# Patient Record
Sex: Female | Born: 1935 | Race: Black or African American | Hispanic: No
Health system: Southern US, Community
[De-identification: ages and names within clinical notes are randomized; demographics above are authoritative.]

## PROBLEM LIST (undated history)

## (undated) DIAGNOSIS — R1310 Dysphagia, unspecified: Secondary | ICD-10-CM

## (undated) DIAGNOSIS — F329 Major depressive disorder, single episode, unspecified: Secondary | ICD-10-CM

## (undated) DIAGNOSIS — F0390 Unspecified dementia without behavioral disturbance: Secondary | ICD-10-CM

## (undated) DIAGNOSIS — D649 Anemia, unspecified: Secondary | ICD-10-CM

## (undated) DIAGNOSIS — I10 Essential (primary) hypertension: Secondary | ICD-10-CM

## (undated) DIAGNOSIS — F32A Depression, unspecified: Secondary | ICD-10-CM

## (undated) DIAGNOSIS — N390 Urinary tract infection, site not specified: Secondary | ICD-10-CM

## (undated) DIAGNOSIS — F419 Anxiety disorder, unspecified: Secondary | ICD-10-CM

## (undated) DIAGNOSIS — C801 Malignant (primary) neoplasm, unspecified: Secondary | ICD-10-CM

---

## 1998-10-15 ENCOUNTER — Emergency Department (HOSPITAL_COMMUNITY): Admission: EM | Admit: 1998-10-15 | Discharge: 1998-10-15 | Payer: Self-pay | Admitting: Emergency Medicine

## 2000-01-03 ENCOUNTER — Emergency Department (HOSPITAL_COMMUNITY): Admission: EM | Admit: 2000-01-03 | Discharge: 2000-01-03 | Payer: Self-pay | Admitting: Emergency Medicine

## 2001-07-11 ENCOUNTER — Ambulatory Visit (HOSPITAL_COMMUNITY): Admission: RE | Admit: 2001-07-11 | Discharge: 2001-07-11 | Payer: Self-pay | Admitting: Family Medicine

## 2001-07-11 ENCOUNTER — Encounter: Payer: Self-pay | Admitting: Family Medicine

## 2001-07-31 ENCOUNTER — Ambulatory Visit (HOSPITAL_COMMUNITY): Admission: RE | Admit: 2001-07-31 | Discharge: 2001-07-31 | Payer: Self-pay | Admitting: Internal Medicine

## 2001-08-30 ENCOUNTER — Ambulatory Visit (HOSPITAL_COMMUNITY): Admission: RE | Admit: 2001-08-30 | Discharge: 2001-08-30 | Payer: Self-pay | Admitting: Internal Medicine

## 2001-08-30 ENCOUNTER — Encounter (INDEPENDENT_AMBULATORY_CARE_PROVIDER_SITE_OTHER): Payer: Self-pay | Admitting: Internal Medicine

## 2001-10-04 ENCOUNTER — Encounter: Admission: RE | Admit: 2001-10-04 | Discharge: 2002-01-02 | Payer: Self-pay | Admitting: Internal Medicine

## 2002-07-29 ENCOUNTER — Encounter: Payer: Self-pay | Admitting: Emergency Medicine

## 2002-07-29 ENCOUNTER — Emergency Department (HOSPITAL_COMMUNITY): Admission: EM | Admit: 2002-07-29 | Discharge: 2002-07-29 | Payer: Self-pay | Admitting: Emergency Medicine

## 2002-07-30 ENCOUNTER — Emergency Department (HOSPITAL_COMMUNITY): Admission: EM | Admit: 2002-07-30 | Discharge: 2002-07-30 | Payer: Self-pay | Admitting: Emergency Medicine

## 2002-08-01 ENCOUNTER — Ambulatory Visit (HOSPITAL_COMMUNITY): Admission: RE | Admit: 2002-08-01 | Discharge: 2002-08-01 | Payer: Self-pay | Admitting: Family Medicine

## 2002-08-01 ENCOUNTER — Encounter: Payer: Self-pay | Admitting: Family Medicine

## 2002-08-05 ENCOUNTER — Emergency Department (HOSPITAL_COMMUNITY): Admission: EM | Admit: 2002-08-05 | Discharge: 2002-08-05 | Payer: Self-pay | Admitting: *Deleted

## 2002-08-05 ENCOUNTER — Encounter: Payer: Self-pay | Admitting: *Deleted

## 2002-08-08 ENCOUNTER — Encounter: Payer: Self-pay | Admitting: Family Medicine

## 2002-08-08 ENCOUNTER — Ambulatory Visit (HOSPITAL_COMMUNITY): Admission: RE | Admit: 2002-08-08 | Discharge: 2002-08-08 | Payer: Self-pay | Admitting: Family Medicine

## 2002-09-22 ENCOUNTER — Emergency Department (HOSPITAL_COMMUNITY): Admission: EM | Admit: 2002-09-22 | Discharge: 2002-09-22 | Payer: Self-pay | Admitting: *Deleted

## 2002-11-24 ENCOUNTER — Emergency Department (HOSPITAL_COMMUNITY): Admission: EM | Admit: 2002-11-24 | Discharge: 2002-11-24 | Payer: Self-pay | Admitting: Emergency Medicine

## 2002-12-24 ENCOUNTER — Emergency Department (HOSPITAL_COMMUNITY): Admission: EM | Admit: 2002-12-24 | Discharge: 2002-12-24 | Payer: Self-pay | Admitting: Emergency Medicine

## 2003-12-08 ENCOUNTER — Ambulatory Visit (HOSPITAL_COMMUNITY): Admission: RE | Admit: 2003-12-08 | Discharge: 2003-12-08 | Payer: Self-pay | Admitting: Family Medicine

## 2004-09-11 ENCOUNTER — Emergency Department (HOSPITAL_COMMUNITY): Admission: EM | Admit: 2004-09-11 | Discharge: 2004-09-11 | Payer: Self-pay | Admitting: Emergency Medicine

## 2005-08-23 ENCOUNTER — Ambulatory Visit (HOSPITAL_COMMUNITY): Admission: RE | Admit: 2005-08-23 | Discharge: 2005-08-23 | Payer: Self-pay | Admitting: Family Medicine

## 2005-08-25 ENCOUNTER — Inpatient Hospital Stay (HOSPITAL_COMMUNITY): Admission: RE | Admit: 2005-08-25 | Discharge: 2005-09-02 | Payer: Self-pay | Admitting: Family Medicine

## 2005-08-27 ENCOUNTER — Encounter: Payer: Self-pay | Admitting: Orthopedic Surgery

## 2005-08-28 ENCOUNTER — Ambulatory Visit: Payer: Self-pay | Admitting: Orthopedic Surgery

## 2005-09-29 ENCOUNTER — Ambulatory Visit (HOSPITAL_COMMUNITY): Admission: RE | Admit: 2005-09-29 | Discharge: 2005-09-29 | Payer: Self-pay | Admitting: Internal Medicine

## 2005-10-08 ENCOUNTER — Emergency Department (HOSPITAL_COMMUNITY): Admission: EM | Admit: 2005-10-08 | Discharge: 2005-10-08 | Payer: Self-pay | Admitting: Emergency Medicine

## 2005-10-13 ENCOUNTER — Ambulatory Visit: Payer: Self-pay | Admitting: Orthopedic Surgery

## 2005-12-08 ENCOUNTER — Ambulatory Visit: Payer: Self-pay | Admitting: Orthopedic Surgery

## 2006-01-18 ENCOUNTER — Emergency Department (HOSPITAL_COMMUNITY): Admission: EM | Admit: 2006-01-18 | Discharge: 2006-01-18 | Payer: Self-pay | Admitting: Emergency Medicine

## 2006-04-08 ENCOUNTER — Inpatient Hospital Stay (HOSPITAL_COMMUNITY): Admission: RE | Admit: 2006-04-08 | Discharge: 2006-04-10 | Payer: Self-pay | Admitting: Internal Medicine

## 2006-06-02 ENCOUNTER — Emergency Department (HOSPITAL_COMMUNITY): Admission: EM | Admit: 2006-06-02 | Discharge: 2006-06-02 | Payer: Self-pay | Admitting: Emergency Medicine

## 2007-07-16 IMAGING — CR DG FEMUR 2V*L*
2 series · 2 of 2 positions shown · non-contrast
Comparison: none

CLINICAL DATA: Left hip pain. Pain in left hip and knee, looking for fracture. 
 LEFT HIP:
 Acute left femoral neck fracture with coxa vara deformity. The distal fragment is displaced superiorly and laterally in relation to the proximal fragment/femoral head. This appears to basically represent a subcapital fracture.

[view not recorded (1 of 2)]
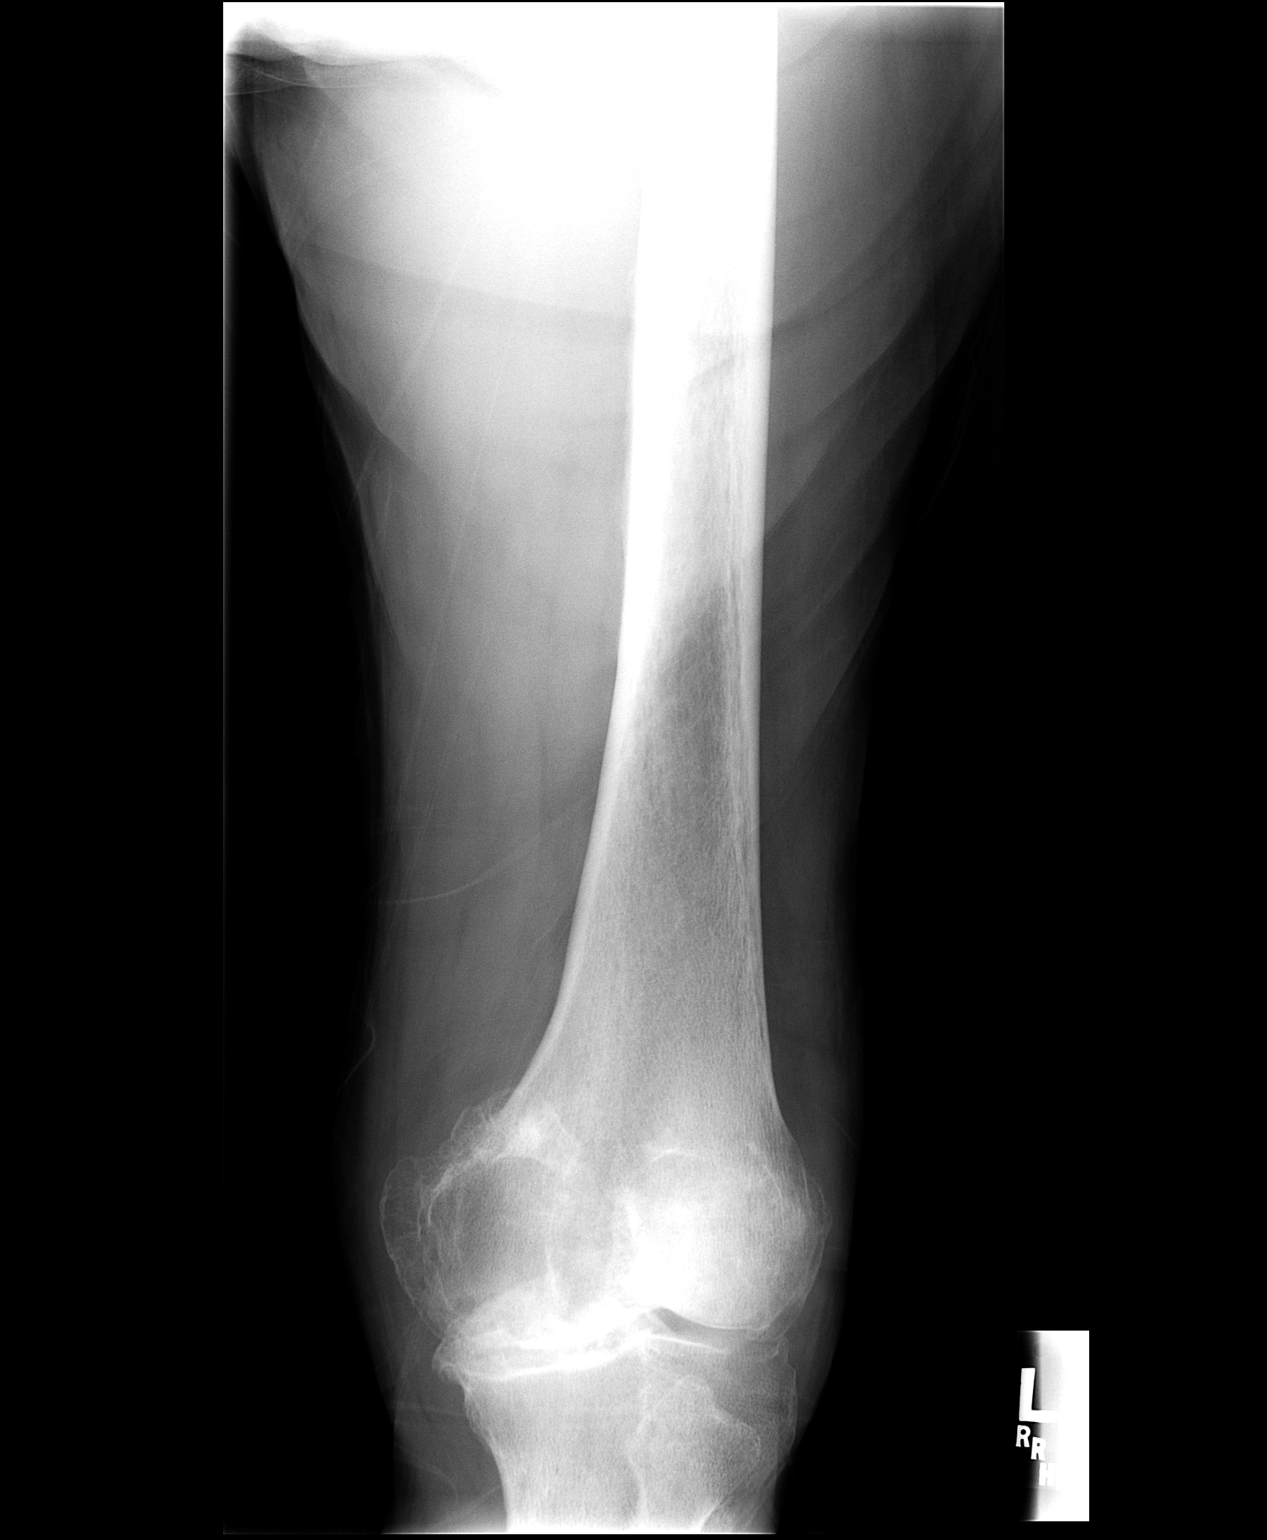

[view not recorded (2 of 2)]
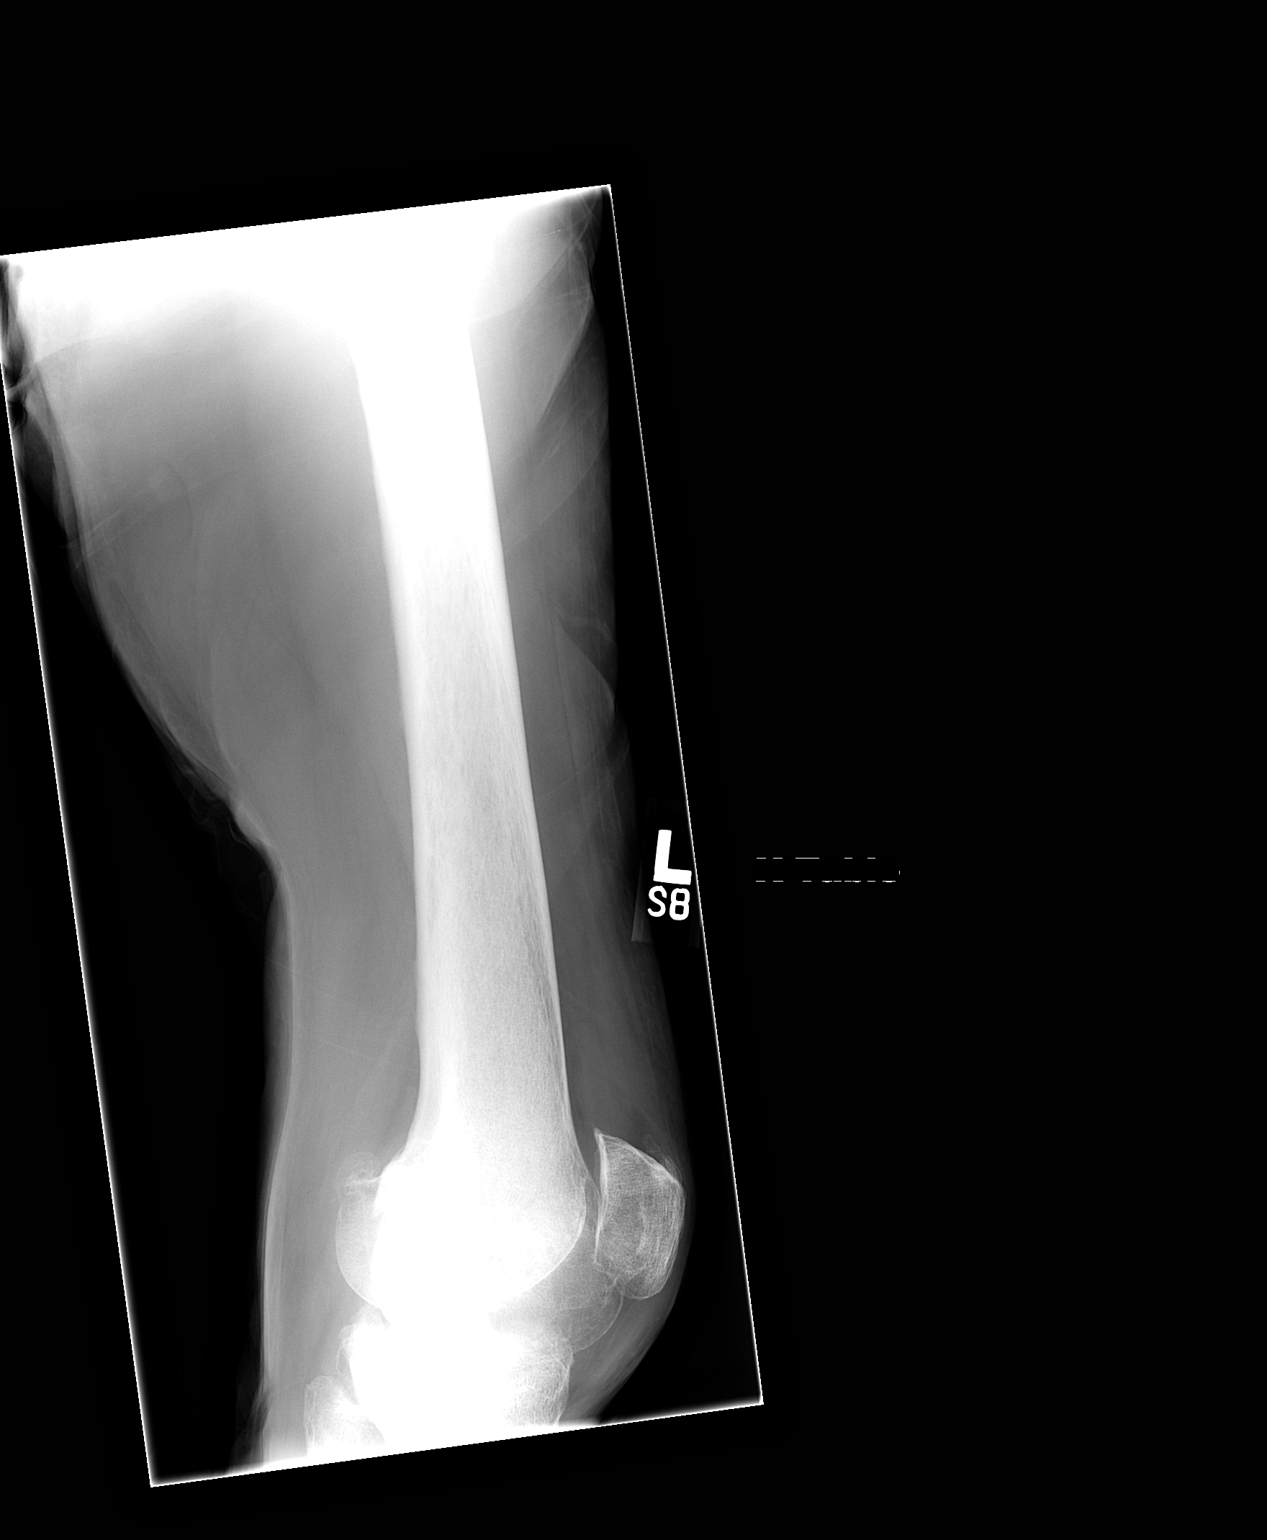

[2 of 2 positions shown; findings below may reference images not displayed]

IMPRESSION: Left femoral subcapital fracture. 
 LEFT FEMUR:
 Left femoral subcapital fracture as described above.   Incidental degenerative O/A changes of the left knee, particularly medial compartment.
IMPRESSION: The most impressive observation is the acute left femoral neck fracture.

## 2007-07-16 IMAGING — CR DG HIP (WITH OR WITHOUT PELVIS) 2-3V*L*
3 series · 3 of 3 positions shown · non-contrast
Comparison: none

CLINICAL DATA: Left hip pain. Pain in left hip and knee, looking for fracture. 
 LEFT HIP:
 Acute left femoral neck fracture with coxa vara deformity. The distal fragment is displaced superiorly and laterally in relation to the proximal fragment/femoral head. This appears to basically represent a subcapital fracture.

[view not recorded (1 of 3)]
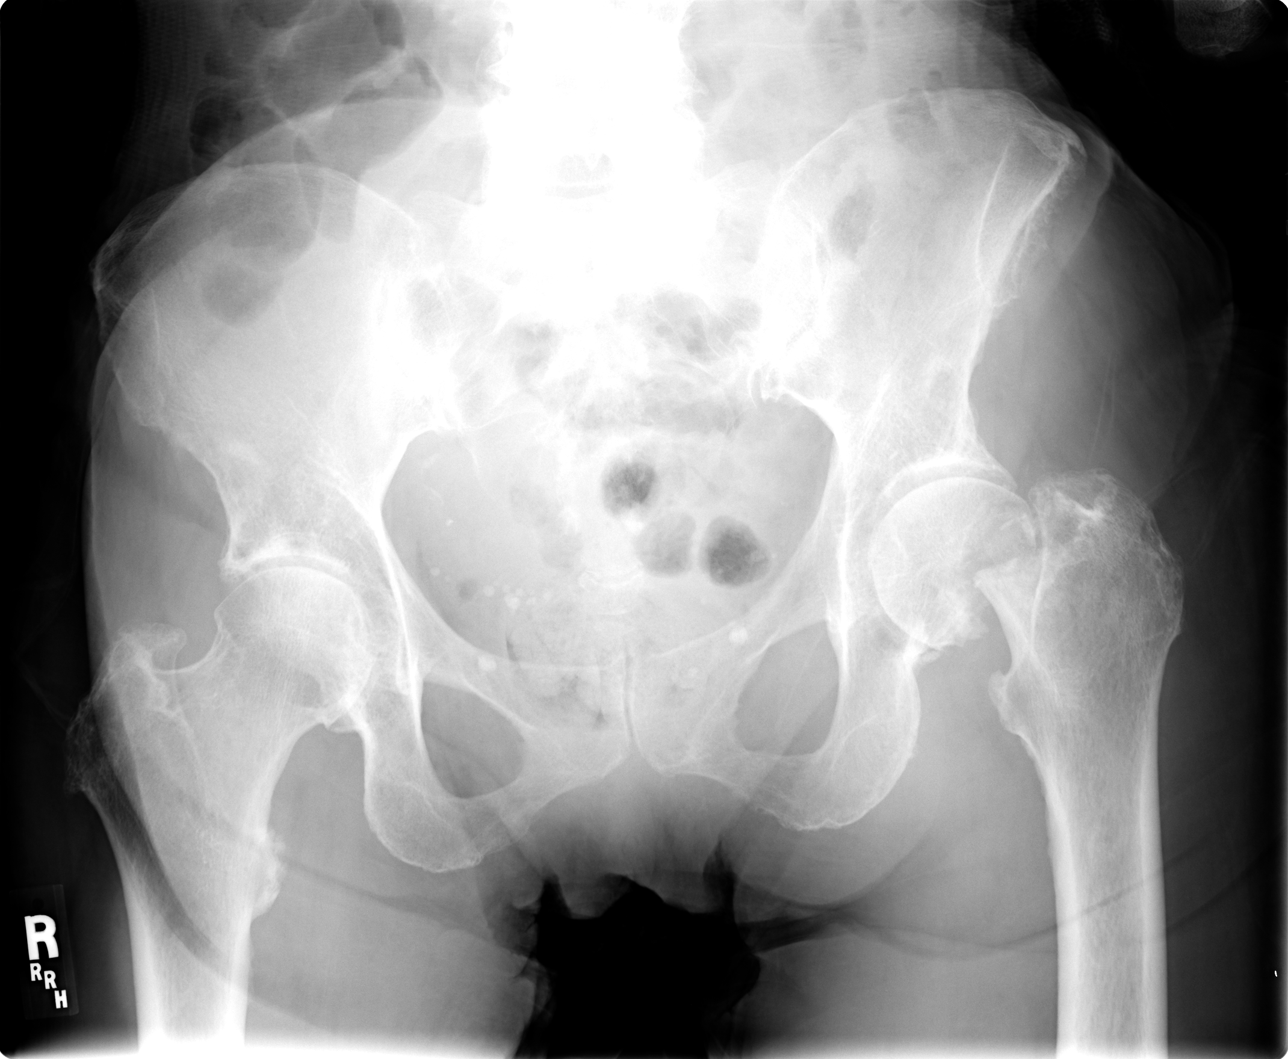

[view not recorded (2 of 3)]
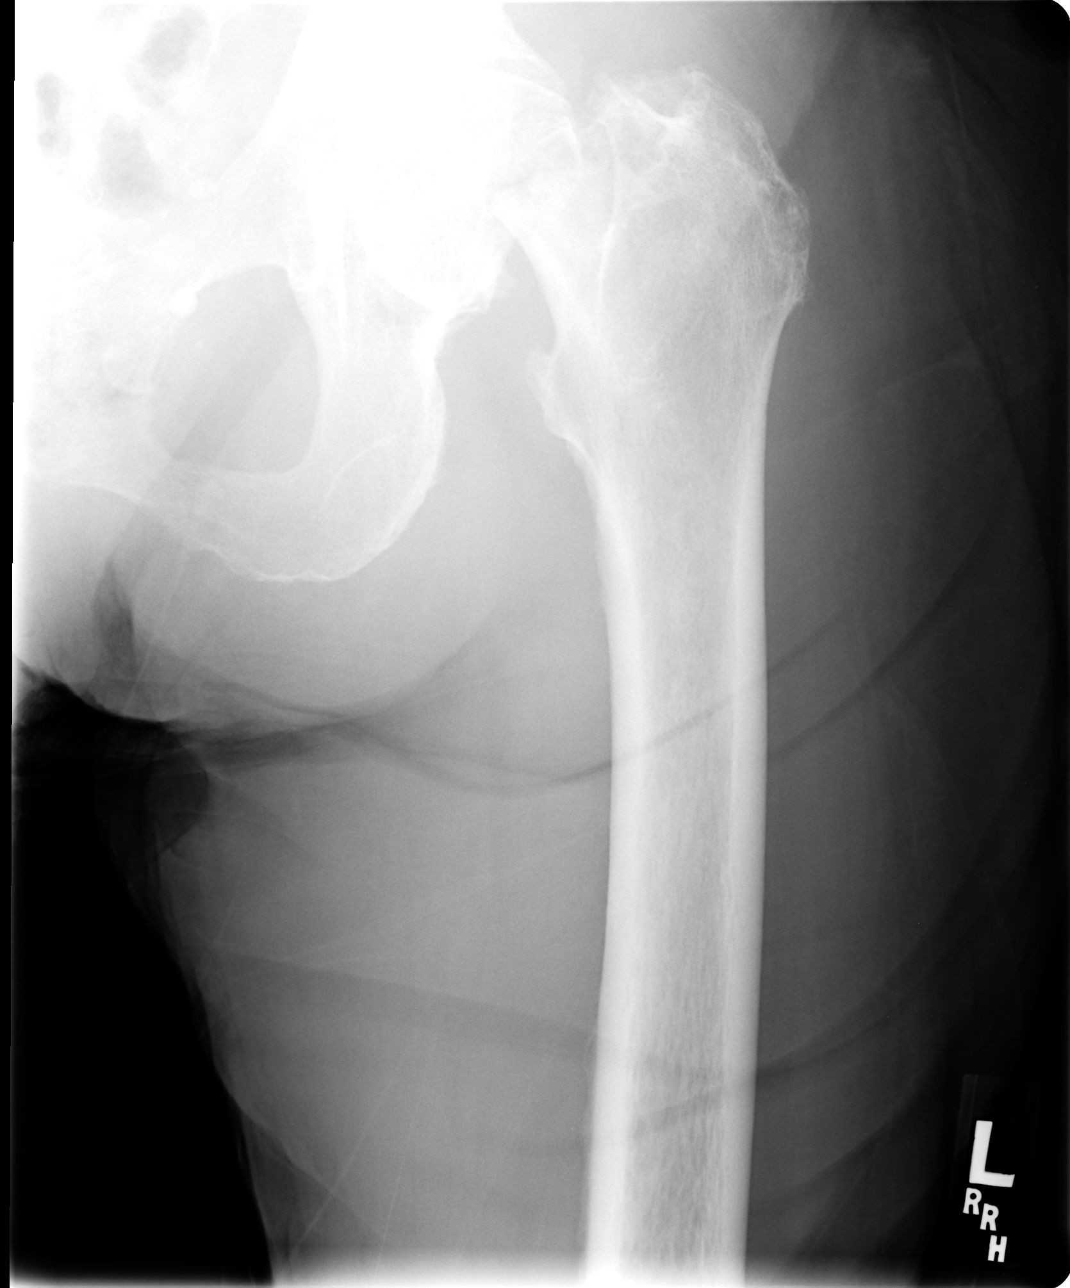

[view not recorded (3 of 3)]
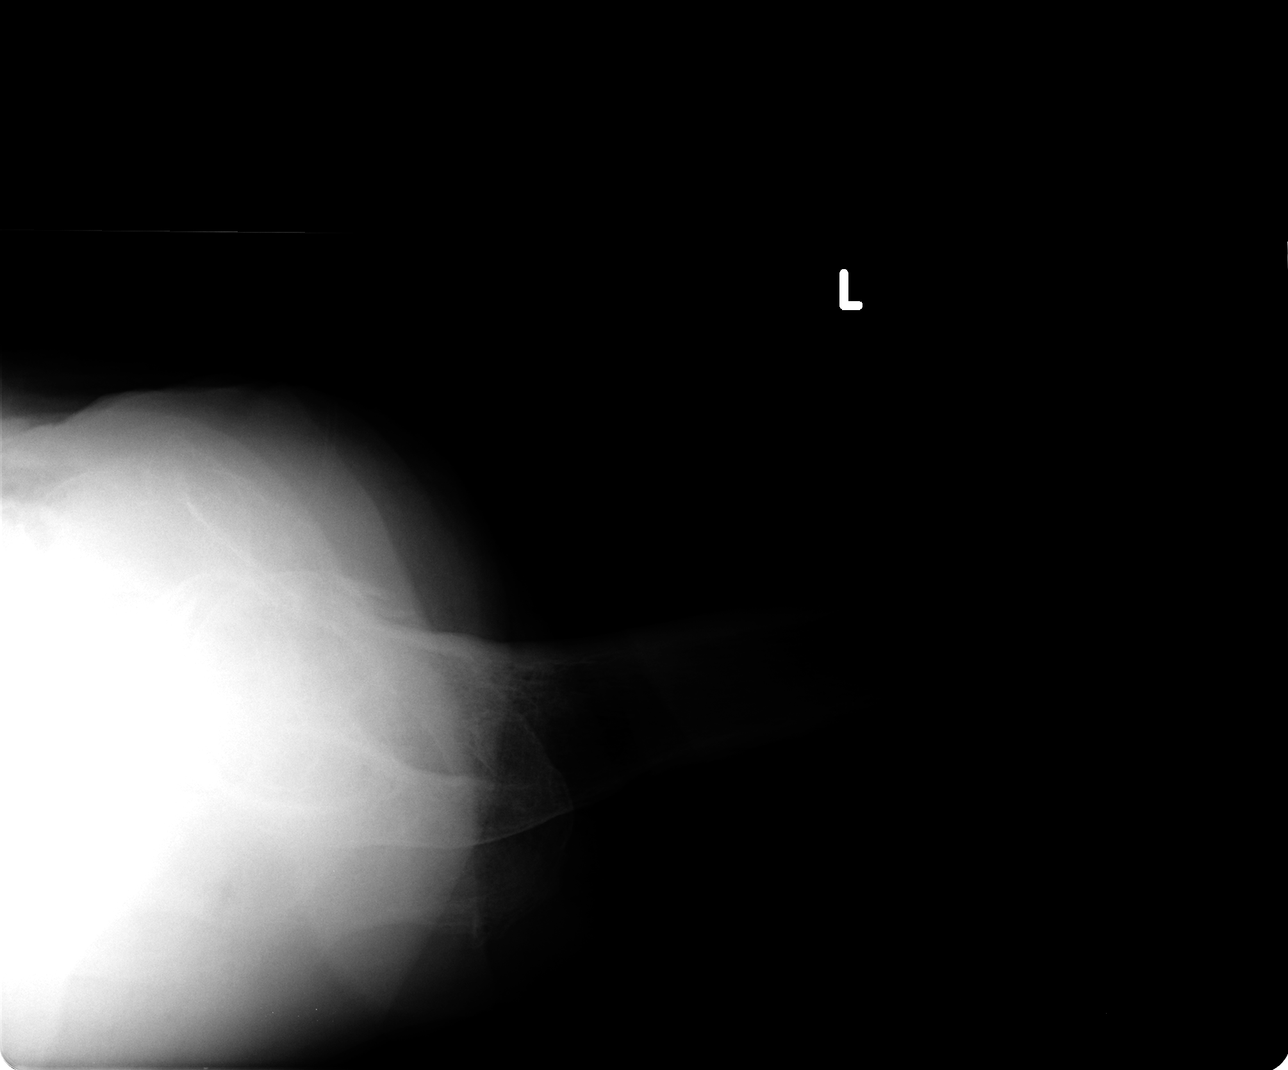

[3 of 3 positions shown; findings below may reference images not displayed]

IMPRESSION: Left femoral subcapital fracture. 
 LEFT FEMUR:
 Left femoral subcapital fracture as described above.   Incidental degenerative O/A changes of the left knee, particularly medial compartment.
IMPRESSION: The most impressive observation is the acute left femoral neck fracture.

## 2008-12-01 ENCOUNTER — Inpatient Hospital Stay (HOSPITAL_COMMUNITY): Admission: EM | Admit: 2008-12-01 | Discharge: 2008-12-05 | Payer: Self-pay | Admitting: Emergency Medicine

## 2009-04-15 ENCOUNTER — Emergency Department (HOSPITAL_COMMUNITY): Admission: EM | Admit: 2009-04-15 | Discharge: 2009-04-15 | Payer: Self-pay | Admitting: Emergency Medicine

## 2009-04-19 ENCOUNTER — Emergency Department (HOSPITAL_COMMUNITY): Admission: EM | Admit: 2009-04-19 | Discharge: 2009-04-19 | Payer: Self-pay | Admitting: Emergency Medicine

## 2010-12-18 ENCOUNTER — Encounter: Payer: Self-pay | Admitting: Family Medicine

## 2010-12-19 ENCOUNTER — Encounter: Payer: Self-pay | Admitting: Internal Medicine

## 2010-12-19 ENCOUNTER — Encounter: Payer: Self-pay | Admitting: Family Medicine

## 2011-03-08 LAB — CBC
HCT: 35.4 % — ABNORMAL LOW (ref 36.0–46.0)
Hemoglobin: 12.1 g/dL (ref 12.0–15.0)
MCHC: 34.3 g/dL (ref 30.0–36.0)
MCV: 87.9 fL (ref 78.0–100.0)
Platelets: 190 10*3/uL (ref 150–400)
RBC: 4.03 MIL/uL (ref 3.87–5.11)
RDW: 15.4 % (ref 11.5–15.5)
WBC: 12.5 10*3/uL — ABNORMAL HIGH (ref 4.0–10.5)

## 2011-03-08 LAB — URINALYSIS, ROUTINE W REFLEX MICROSCOPIC
Bilirubin Urine: NEGATIVE
Glucose, UA: NEGATIVE mg/dL
Ketones, ur: NEGATIVE mg/dL
Leukocytes, UA: NEGATIVE
Nitrite: NEGATIVE
Protein, ur: 30 mg/dL — AB
Specific Gravity, Urine: 1.03 (ref 1.005–1.030)
Urobilinogen, UA: 0.2 mg/dL (ref 0.0–1.0)
pH: 6 (ref 5.0–8.0)

## 2011-03-08 LAB — DIFFERENTIAL
Basophils Absolute: 0 10*3/uL (ref 0.0–0.1)
Basophils Relative: 0 % (ref 0–1)
Eosinophils Absolute: 0 10*3/uL (ref 0.0–0.7)
Eosinophils Relative: 0 % (ref 0–5)
Lymphocytes Relative: 12 % (ref 12–46)
Lymphs Abs: 1.5 10*3/uL (ref 0.7–4.0)
Monocytes Absolute: 1.3 10*3/uL — ABNORMAL HIGH (ref 0.1–1.0)
Monocytes Relative: 10 % (ref 3–12)
Neutro Abs: 9.7 10*3/uL — ABNORMAL HIGH (ref 1.7–7.7)
Neutrophils Relative %: 78 % — ABNORMAL HIGH (ref 43–77)

## 2011-03-08 LAB — BASIC METABOLIC PANEL
BUN: 16 mg/dL (ref 6–23)
CO2: 30 mEq/L (ref 19–32)
Calcium: 9 mg/dL (ref 8.4–10.5)
Chloride: 102 mEq/L (ref 96–112)
Creatinine, Ser: 0.89 mg/dL (ref 0.4–1.2)
GFR calc Af Amer: >60 mL/min (ref >60)
GFR calc non Af Amer: >60 mL/min (ref >60)
Glucose, Bld: 99 mg/dL (ref 70–99)
Potassium: 4.5 mEq/L (ref 3.5–5.1)
Sodium: 138 mEq/L (ref 135–145)

## 2011-03-08 LAB — URINE MICROSCOPIC-ADD ON

## 2011-03-14 LAB — COMPREHENSIVE METABOLIC PANEL
ALT: 15 U/L (ref 0–35)
AST: 18 U/L (ref 0–37)
Calcium: 8.9 mg/dL (ref 8.4–10.5)
Creatinine, Ser: 2.68 mg/dL — ABNORMAL HIGH (ref 0.4–1.2)
GFR calc Af Amer: 20 mL/min — ABNORMAL LOW (ref >60)
GFR calc non Af Amer: 17 mL/min — ABNORMAL LOW (ref >60)
Glucose, Bld: 161 mg/dL — ABNORMAL HIGH (ref 70–99)
Sodium: 157 mEq/L — ABNORMAL HIGH (ref 135–145)
Total Protein: 7.9 g/dL (ref 6.0–8.3)

## 2011-03-14 LAB — URINALYSIS, ROUTINE W REFLEX MICROSCOPIC
Glucose, UA: NEGATIVE mg/dL
Nitrite: POSITIVE — AB
Specific Gravity, Urine: >1.03 — ABNORMAL HIGH (ref 1.005–1.030)
pH: 5.5 (ref 5.0–8.0)

## 2011-03-14 LAB — VALPROIC ACID LEVEL: Valproic Acid Lvl: <10 ug/mL — ABNORMAL LOW (ref 50.0–100.0)

## 2011-03-14 LAB — CBC
MCHC: 31.4 g/dL (ref 30.0–36.0)
MCV: 86.3 fL (ref 78.0–100.0)
RDW: 16.6 % — ABNORMAL HIGH (ref 11.5–15.5)

## 2011-03-14 LAB — DIFFERENTIAL
Eosinophils Absolute: 0 10*3/uL (ref 0.0–0.7)
Lymphocytes Relative: 10 % — ABNORMAL LOW (ref 12–46)
Lymphs Abs: 1.5 10*3/uL (ref 0.7–4.0)
Monocytes Relative: 2 % — ABNORMAL LOW (ref 3–12)
Neutrophils Relative %: 88 % — ABNORMAL HIGH (ref 43–77)

## 2011-03-14 LAB — BASIC METABOLIC PANEL
BUN: 15 mg/dL (ref 6–23)
BUN: 51 mg/dL — ABNORMAL HIGH (ref 6–23)
BUN: 71 mg/dL — ABNORMAL HIGH (ref 6–23)
CO2: 28 mEq/L (ref 19–32)
CO2: 29 mEq/L (ref 19–32)
CO2: 29 mEq/L (ref 19–32)
CO2: 31 mEq/L (ref 19–32)
Calcium: 8.3 mg/dL — ABNORMAL LOW (ref 8.4–10.5)
Chloride: 113 mEq/L — ABNORMAL HIGH (ref 96–112)
Chloride: 119 mEq/L — ABNORMAL HIGH (ref 96–112)
Chloride: 121 mEq/L — ABNORMAL HIGH (ref 96–112)
Creatinine, Ser: 1.33 mg/dL — ABNORMAL HIGH (ref 0.4–1.2)
Creatinine, Ser: 1.59 mg/dL — ABNORMAL HIGH (ref 0.4–1.2)
GFR calc Af Amer: 46 mL/min — ABNORMAL LOW (ref >60)
GFR calc non Af Amer: 20 mL/min — ABNORMAL LOW (ref >60)
Glucose, Bld: 104 mg/dL — ABNORMAL HIGH (ref 70–99)
Glucose, Bld: 132 mg/dL — ABNORMAL HIGH (ref 70–99)
Glucose, Bld: 75 mg/dL (ref 70–99)
Glucose, Bld: 96 mg/dL (ref 70–99)
Potassium: 3.8 mEq/L (ref 3.5–5.1)
Potassium: 3.9 mEq/L (ref 3.5–5.1)
Potassium: 4.4 mEq/L (ref 3.5–5.1)
Sodium: 144 mEq/L (ref 135–145)
Sodium: 156 mEq/L — ABNORMAL HIGH (ref 135–145)

## 2011-03-14 LAB — URINE MICROSCOPIC-ADD ON

## 2011-03-14 LAB — URINE CULTURE

## 2011-04-12 NOTE — Discharge Summary (Signed)
[name], [name]                ACCOUNT NO.:  [id number]   MEDICAL RECORD NO.:  [id number]          PATIENT TYPE:  INP   LOCATION:  A316                          FACILITY:  [facility]   PHYSICIAN:  Tesfaye D. Felecia Shelling, MD   DATE OF BIRTH:  [birth year]   DATE OF ADMISSION:  12/01/2008  DATE OF DISCHARGE:  01/08/2010LH                               DISCHARGE SUMMARY   DISCHARGE DIAGNOSIS:  1. Acute renal insufficiency secondary to dehydration.  2. Hypernatremia.  3. E. coli (Escherichia coli) urinary tract infection.  4. __________.  5. History of cancer of the breast.  6. Remote history of pulmonary embolism.   DISCHARGE MEDICATIONS:  1. Celexa 20 mg p.o. daily.  2. __________ mg daily.  3. Aricept 10 mg daily.  4. Clonazepam 0.5 mg b.i.d.  5. Namenda 10 mg b.i.d.  6. Risperdal 0.25 mg at bedtime.  7. Depakote 125 mg at bedtime.  8. Lortab 5/500 one tablet p.o. q.4 hours p.r.n.  9. Augmentin 500 mg p.o. t.i.d. for 3 more days.   DISPOSITION:  The patient will be discharged back to [facility].   HOSPITAL COURSE:  This is an 77 year old female patient who was a  resident of a nursing home who was brought to the emergency room due to  change in mental status.  During evaluation in the emergency room the  patient was found to have significant renal insufficiency secondary to  dehydration.  Her sodium was also elevated.  She has sign of urinary  tract infection.  Her urine culture grew E. coli.  The patient was  treated with IV Rocephin.  Over the hospital stay the patient improved.  Her urine was corrected to normal range with a BUN of 50, creatinine  1.5.  The patient is back to her baseline.  She will be discharged back  to nursing home to continue her regular treatment.      Tesfaye D. Felecia Shelling, MD  Electronically Signed    TDF/MEDQ  D:  12/05/2008  T:  12/05/2008  Job:  [id number]

## 2011-04-12 NOTE — H&P (Signed)
Yolanda [name], Yolanda [name]                ACCOUNT NO.:  [id number]   MEDICAL RECORD NO.:  [id number]          PATIENT TYPE:  INP   LOCATION:  A316                          FACILITY:  [facility]   PHYSICIAN:  Tesfaye D. Felecia Shelling, MD   DATE OF BIRTH:  23-Jan-1921   DATE OF ADMISSION:  12/01/2008  DATE OF DISCHARGE:  LH                              HISTORY & PHYSICAL   CHIEF COMPLAINT:  Change in mental status.   HISTORY OF PRESENT ILLNESS:  This is an 77 year old female patient with  history of multiple medical issues who is a resident of [facility] who was brought to the emergency room with the above complaint. The  nursing staff noticed decline in patient's condition in the last three  days. Her p.o. intake decreased, and the patient was unable to ambulate.  She was more confused and decerebrated. She was brought to the emergency  room where she was evaluated and found to have elevated BUN, creatinine,  and sodium. The patient also had signs of urinary tract infection. She  was started on IV antibiotics and IV fluids. The patient is admitted for  further treatment.   REVIEW OF SYSTEMS:  The patient is currently confused and disoriented  and unable to give any history.   PAST MEDICAL HISTORY:  1. Advanced dementia.  2. Anemia of chronic disease.  3. History of pulmonary embolism.  4. Hypertension.  5. Osteoporosis.  6. History of remote CA of the breast.  7. History of CVA.   CURRENT MEDICATIONS:  1. Celexa 20 mg daily.  2. Lisinopril 10 mg daily.  3. Aricept 10 mg daily.  4. Clonazepam 0.5 mg b.i.d.  5. Namenda 10 mg b.i.d.  6. Risperidone 0.25 mg at bedtime.  7. Depakote 225 mg at bedtime.  8. Lortab 5/500 one tablet p.o. q.4 h. P.r.n.   SOCIAL HISTORY:  The patient is currently a resident of [facility]. No history of alcohol, tobacco, or substance abuse.   PHYSICAL EXAMINATION:  The patient is opening her eyes. She is confused,  disoriented, and __________ blood  pressure 125/61, pulse 70, respiratory  rate 18, temperature 97.8 degrees Fahrenheit.  HEENT:  Pupils are equal and reactive.  NECK:  Is supple.  CHEST:  Decreased air entry, few rhonchi.  CARDIOVASCULAR SYSTEM:  First and second heart sounds heard, regular.  ABDOMEN:  Soft and lax. Bowel sounds are positive. No mass or  organomegaly.  EXTREMITIES:  No leg edema.   ADMISSION LABORATORY DATA:  CBC:  WBC 14.9, hemoglobin 12.9, hematocrit  41.2, platelets 219. BMP:  Sodium 157, potassium 4.0, chloride 118,  carbon dioxide 32, glucose 161, BUN 75, creatinine 2.8, and calcium 8.9.  Urinalysis showed WBC 25-50, bacteria many, pH 5.5, and specific gravity  greater than 1013.   ASSESSMENT:  1. Acute renal insufficiency secondary to dehydration.  2. Hypernatremia.  3. Urinary tract infection.  4. Severe dementia.  5. Hypertension.  6. History of pulmonary embolism.  7. Remote history of carcinoma of the breast.   PLAN:  Will continue patient on IV fluids.  Will try to graduate  rehydrate. Continue IV Rocephin. Will continue to assist and have daily  living activity. Continue her regular medications.      Tesfaye D. Felecia Shelling, MD  Electronically Signed     TDF/MEDQ  D:  12/02/2008  T:  12/02/2008  Job:  [id number]

## 2011-04-15 NOTE — Discharge Summary (Signed)
Yolanda [name], GUYETT                ACCOUNT NO.:  [id number]   MEDICAL RECORD NO.:  [id number]          PATIENT TYPE:  INP   LOCATION:  A318                          FACILITY:  [facility]   PHYSICIAN:  Hanley Hays. Dechurch, M.D.DATE OF BIRTH:  [birth year]   DATE OF ADMISSION:  08/25/2005  DATE OF DISCHARGE:  [facility]                                 DISCHARGE SUMMARY   DIAGNOSES:  1.  Left hip fracture post fall, status post left hip unipolar replacement.  2.  Dementia, progressive moderate to severe.  3.  Osteoporosis.  4.  Remote history of breast cancer without evidence of recurrent.  5.  History of a small left parieto-occipital focus with possible old      lacunar infarction.  6.  Urinary tract infection, urine culture pending.  7.  Anemia postoperative requiring transfusion, hemoglobin 10 at the time of      discharge.  8.  Depression.   DISPOSITION:  Skilled nursing facility for rehab.   MEDICATIONS:  1.  Namenda 10 b.i.d.  2.  Aricept 10 h.s.  3.  Prozac 20 daily.  4.  Tylenol 1 gram t.i.d. for 2 weeks, then q.6h. p.r.n. pain.  5.  Lovenox 40 mg subcu to continue for 21 days.  6.  Ativan 0.5 mg q.4h. p.o. p.r.n. anxiety.  7.  Lortab 5/500 q.4h. p.r.n. pain.   DIET:  Regular diet.   ACTIVITY:  PT, OT.   HOSPITAL COURSE:  An 77 year old African-American female, retired Runner, broadcasting/film/video  with dementia who lives at home with her elderly husband who is also in the  hospital at this time as well as 24-hour caregivers fell several days prior  to admission. Because of ongoing complaints of pain, she had an x-ray  performed which revealed the left femoral subcapital fracture. She was seen  in consultation by Dr. Romeo Apple. After she was cleared medically, she  underwent surgery on August 27, 2005. She underwent unipolar hip  replacement. She tolerated the procedure well. She had postoperative anemia  requiring 1 unit of packed red cells and hemoglobins remained stable. She  did have some  postoperative fever. A follow-up urinalysis was unremarkable  with the cultures growing gram-negative rods. She will be empirically  started on Levaquin for 3 days while culture is pending. On echocardiogram,  she was noted to have mild to moderate aortic stenosis but no significant  flow obstruction. There is also question of echodensity just below mitral  valve which may be attributed to mitral valve apparatus which has been torn  or a subvalvular defect. Though possibility of vegetation could not be ruled  out, the patient had no evidence to suggest that she had SBE. In addition,  her EF was estimated at 40-45% with mild global hypokinesis and diastolic  dysfunction of the elderly. She had mild concentric LVH as well. In the  hospital, her blood pressures were in the 138s and 140s. Initially, she was  quite confused but returned to her baseline status. Her pain was managed  well. Initially, she was going to go home with her 24-hour caregivers.  However,  family is intending to move the patient to be closer to them in  [place] and feels that she would be better served in a skilled nursing  facility. Arrangements were made for that to occur.   At the time of discharge, this is a pleasant elderly female, alert, verbally  fluent without any complaints.  LUNGS:  Her lungs were clear to auscultation.  HEART:  Heart is regular. There is a soft systolic murmur at the right  sternal border which radiates laterally but no gallop was present.  ABDOMEN:  The abdomen is soft and nontender.  EXTREMITIES:  Without clubbing, cyanosis. There is no edema. She has an  abduction pillow place.  SKIN:  Her skin exam reveals a sacral prominence with 3 cm wounds, silver-  dollar size on the left and quarter size on the right which were stage II.  This should be treated with DuoDerm. Her heels were intact.  NEUROLOGICAL:  Baseline mental status and nonfocal exam.   ASSESSMENT/PLAN:  1.  Status post left hip  fracture post fall.  2.  Progressive dementia.  3.  Depression.  4.  Urinary tract infection.  5.  Postoperative anemia status post 1 unit packed red cells.  6.  Sacral decubitus ulcers.  7.  Mild left ventricular dysfunction by echocardiogram without evidence of      decompensation.  8.  Borderline hypertension.   Given her advanced age and progressive dementia, our goal is to optimize her  comfort and functional status. At this point, no further interventions we  entertained. This can be further managed in the nursing facility with  observation.      Hanley Hays Josefine Class, M.D.  Electronically Signed     FED/MEDQ  D:  09/01/2005  T:  09/01/2005  Job:  [id number]

## 2011-04-15 NOTE — Op Note (Signed)
Yolanda [name], Yolanda [name] NO.:  [id number]   MEDICAL RECORD NO.:  [id number]          PATIENT TYPE:  INP   LOCATION:  A318                          FACILITY:  [facility]   PHYSICIAN:  Vickki Hearing, M.D.DATE OF BIRTH:  Mar 26, 1921   DATE OF PROCEDURE:  08/27/2005  DATE OF DISCHARGE:                                 OPERATIVE REPORT   HISTORY:  This is an 77 year old female who fell and fractured her left hip  last Sunday, complained of pain a few days later. X-ray showed the fractured  hip. She was admitted under the services of Dr. Josefine Class. After medical  clearance, she was scheduled for surgery.   PREOPERATIVE DIAGNOSIS:  Displaced left femoral neck fracture.   POSTOPERATIVE DIAGNOSIS:  Displaced left femoral neck fracture.   PROCEDURE:  Left hip prosthetic replacement with a DePuy Press-Fit unipolar  hip stem, size 5 femur, -3 neck, 49 head.   ANESTHETIC:  Spinal.   SURGEON:  Dr. Romeo Apple assisted by Irena Reichmann.   BLOOD LOSS:  Approximately 50 cc. There was no drain. No complications.   The surgery was done as follows:  The patient's leg had previously been  marked for surgery, history and physical was updated, and consent was  signed. Antibiotics were given. He was taken to the operating room and had a  spinal anesthetic, placed laterally in the decubitus position left side up.  After sterile prep and drape, time-out was taken and completed.   Incision was made and centered over the greater trochanter, taken down to  the underlying fascia which was split in line with the skin incision  exposing the abductor mechanism. This was peeled from the greater trochanter  using the anterior two thirds. This was reflected superiorly. The capsule  was preserved via incision and placing stay sutures in the capsule. Head was  removed, measured size 49. Acetabulum was inspected, irrigated, cleaned. Hip  was dislocated anteriorly using a box osteotome, starter reamer,  and canal  finder. The proximal femur was entered, entry point was lateralized, and the  broaches were started at a size 1, 3, 4, and 5; got a nice tight fit,  trialed a -3 with a 49 head, reduced the hip, got a stable reduction,  removed the trial components, passed sutures through drill holes in the  greater trochanter, placed our regular prosthesis and head and neck,  rereduced the hip, ranged it, got the same range of motion as initial trial,  and then closed the hip capsule with #1 Bralon, closed the abductors with  the sutures which were passed through the drill holes in the trochanter with  #5 Tycron. Then we closed the fascia with #1 Bralon, 2-0  Monocryl for the subcu, staples for the skin. We injected 30 cc of  Sensorcaine with epinephrine, placed sterile dressings, and then put the  patient on regular bed with abduction pillow in place.   We will have a standard protocol with full weightbearing, and she will  probably need rehab placement.      Vickki Hearing, M.D.  Electronically Signed  SEH/MEDQ  D:  08/27/2005  T:  08/27/2005  Job:  [id number]

## 2011-04-15 NOTE — Procedures (Signed)
Yolanda [name], Yolanda [name]                ACCOUNT NO.:  [id number]   MEDICAL RECORD NO.:  [id number]          PATIENT TYPE:  INP   LOCATION:  A318                          FACILITY:  [facility]   PHYSICIAN:  Dani Gobble, MD       DATE OF BIRTH:  1921-01-13   DATE OF PROCEDURE:  08/26/2005  DATE OF DISCHARGE:                                  ECHOCARDIOGRAM   DATE OF PROCEDURE:  August 26, 2005   REFERRING:  Dr. Josefine [name]   INDICATION:  Yolanda [name] is an 77 year old female who had a left hip  fracture who was referred for syncope.   The technical quality of this study is adequate.   The aorta measures normally at 3 cm.   The left atrium also appears to be within normal limits measured at 3.4 cm.   The interventricular septum and posterior wall are mildly thickened measured  at 1.3 cm for each.   The aortic valve is not well visualized but is heavily calcified with  limited leaflet excursion. No significant aortic insufficiency is noted.  Doppler interrogation of the aortic valve reveals a peak velocity of 2 2  m/sec corresponding to a peak gradient of 18 mm Hg and a mean gradient of 14  mm Hg with an area by Doppler of 0.7 sq cm. Visually this appears to be  moderate aortic stenosis although by Doppler interrogation it appears to be  more on the milder end of the spectrum. Leaflet mobility is still  recognized.   The mitral valve appears mild to moderately thickened. There is moderate  mitral annular calcification. There appears to be some malcoaptation of the  valve leaflets but mitral valve prolapse is not appreciated. Mild mitral  regurgitation is noted. There is a mobile echodensity just below the mitral  valve apparatus which may represent torn minor subvalvular apparatus but  vegetation certainly needs to be included in the differential diagnosis.  Doppler interrogation of the mitral valve is within normal limits.   The pulmonic valve is not well visualized.   The tricuspid  valve appears grossly structurally normal. Mild tricuspid  regurgitation is noted. Estimated RVSP is approximately  38 mm Hg.   The left ventricle is normal in size with the LVIDD measured at 4.2 cm and  the LVISD measured at 3.2 cm. Overall left ventricular systolic function is  diminished with an estimated ejection fraction of 40-45%. There is mild  global hypokinesis while the anterior septal wall appears moderate to  severely hypokinetic. Additionally, the inferior and posterior walls also  are suggestive of hypokinesis although this is not clearly delineated. The  presence of diastolic dysfunction is inferred from pulsar Doppler across the  mitral valve.   The right atrium and right ventricular systolic function appears to be  normal.   IMPRESSION:  1.  Mild concentric left ventricular hypertrophy.  2.  Mild to moderate aortic stenosis: Visually it appears to be more      moderate and by Doppler it appears to be mild. Leaflet mobility is still      appreciated.  3.  Thickened mitral valve without significant limitation to leaflet      excursion. There does appear to be some mild coaptation of the leaflets      although mitral valve prolapse is not appreciated. Mild mitral      regurgitation is noted.  4.  There is a mobile echodensity just below the mitral valve apparatus and      may be attached to the mitral valve apparatus which may represent torn      minor subvalvular apparatus, however, the possibility of a vegetation      should also be considered in the differential diagnosis.  5.  Mild to moderate mitral annular calcification.  6.  Mild tricuspid regurgitation.  7.  Mild pulmonary hypertension.  8.  Normal left ventricular size with diminished ejection fraction estimated      at 40-45%. There is mild global hypokinesis, although the anteroseptal      wall appears to be moderately to severely hypokinetic.  9.  Presence of diastolic dysfunction is inferred from pulsar  Doppler across      the mitral valve.  10. Right ventricle systolic function appears to be preserved.           ______________________________  Dani Gobble, MD     AB/MEDQ  D:  08/26/2005  T:  08/26/2005  Job:  [id number]   cc:   Yolanda [name]. Yolanda [name], M.D.  Fax: [phone]

## 2011-04-15 NOTE — Consult Note (Signed)
NAMEPHYLICIA, Yolanda [name]                ACCOUNT NO.:  [id number]   MEDICAL RECORD NO.:  [id number]          PATIENT TYPE:  INP   LOCATION:  A318                          FACILITY:  [facility]   PHYSICIAN:  Vickki Hearing, M.D.DATE OF BIRTH:  09/18/1921   DATE OF CONSULTATION:  08/26/2005  DATE OF DISCHARGE:                                   CONSULTATION   REQUESTING PHYSICIAN:  Hanley Hays. Dechurch, M.D.   CHIEF COMPLAINT:  Left hip fracture.   HISTORY:  This is an 77 year old female who lives at home with an elderly  husband and a 24-hour caregiver, who apparently fell on Sunday, complained  of hip pain on the 28th, and was brought in after radiographs showed a left  femoral neck fracture.  She has dementia.  Complete history is taken from  the dictated history and physical by Dr. Josefine Class.   She is on Aricept and Namenda with no allergies.   Social history, review of systems is recorded.   My examination is as follows:  VITAL SIGNS:  Temperature 97, respiratory rate 18, pulse 77, blood pressure  166/76.  GENERAL:  This is a thin female, tall, lean, resting comfortably on her  back, noncombative.  CARDIOVASCULAR:  No peripheral edema, good pulses, normal temperature.  SKIN:  Intact, including the heels, with no evidence of pressure sore.  EXTREMITIES:  Her upper extremities show no lesions, no bruising.  NEUROLOGIC:  Mental status:  Again, dementia, but awake and alert.  Neurologic:  No focal signs.  LYMPHATIC:  Normal cervical, supraclavicular, groin.  MUSCULOSKELETAL:  Gait and station is deferred.  The patient has a hip  fracture.  Upper extremities:  Alignment normal, no subluxation,  dislocation, atrophy, tremor or malalignment.  Lower extremity right:  Normal range of motion, strength, stability, alignment.  No subluxation of  the joints.  No dislocations, atrophy of tremor.  Left lower extremity in  traction.  Any attempts at motion cause pain.   LABORATORY EXAMINATION:   Chem-7 normal.  Calcium 8.6.  Hemoglobin 11.9,  platelet count 184.  LFTs normal.  Protein total 5.5, low.  Albumin 2.8,  low.  Calcium 8.5, normal.  PT/INR 12.7 and 0.9.   Radiographs show a left femoral neck fracture with significant amount of  shortening.  This is a complete displaced fracture.   DIAGNOSIS:  Left femoral neck fracture.   PLAN:  Prosthetic replacement, left hip.      Vickki Hearing, M.D.  Electronically Signed     SEH/MEDQ  D:  08/27/2005  T:  08/27/2005  Job:  [id number]

## 2011-04-15 NOTE — H&P (Signed)
Yolanda [name], Yolanda [name]                ACCOUNT NO.:  [id number]   MEDICAL RECORD NO.:  [id number]          PATIENT TYPE:  INP   LOCATION:  A217                          FACILITY:  [facility]   PHYSICIAN:  Hanley Hays. Dechurch, M.D.DATE OF BIRTH:  May 17, 1921   DATE OF ADMISSION:  04/08/2006  DATE OF DISCHARGE:  LH                                HISTORY & PHYSICAL   HISTORY OF PRESENT ILLNESS:  The patient is an 77 year old resident of  [facility] with moderate to severe dementia, history of a left hip fracture in  October 2006, who is basically bed to chair with assist in ambulation, who  was in her usual state of health until yesterday when it was noted by  nursing staff that she had increasing edema in her left lower extremity.  A  D-dimer was ordered and returned at 17.  She was empirically started on  Lovenox, and ultrasound to the left lower leg revealed no evidence of DVT;  however, CT of the chest revealed multiple pulmonary emboli primarily in the  right lung.  She had no complaints of shortness of breath.  She has had some  intermittent hypertension but has had no deterioration in status.  However,  she is being admitted to the hospital for anticoagulation therapy.   PAST MEDICAL HISTORY:  1.  Left hip fracture status post unipolar hip replacement, October 2006,      without complication.  2.  Progressive dementia with combativeness and agitation, improved.  3.  Osteoporosis.  4.  Remote history of breast cancer.  5.  History of left parieto-occipital lacunar infarct.  6.  History of hypertension.  7.  History of anemia, postoperative.   MEDICATIONS:  1.  Namenda 10 b.i.d.  2.  Aricept 10 daily.  3.  Citalopram 20 daily.  4.  Risperdal 1 mg b.i.d.  5.  Klonopin 0.5 b.i.d.  6.  Calcium plus D t.i.d. and a multivitamin with minerals daily.  7.  Lortab 5/500 q.4 h p.r.n. pain or Tylenol as needed.  8.  Xanax 0.25 mg q.4 h p.r.n. anxiety.  9.  Risperdal 0.5 mg p.o. q.4 h p.r.n.  paranoia.   ALLERGIES:  No known drug allergies.   REVIEW OF SYSTEMS:  She requires assistance for all ADLs.  She is usually  able to communicate her needs.  She does sundown and occasionally becomes  quite agitated, but this has been improved.  Weight has been stable; p.o.  intake is good.  She is incontinent of bladder but occasionally is able to  be continent.  She has no pain complaints, no respiratory complaints,  otherwise has remained clinically stable.  Blood pressure in the nursing  home were noted to be sometimes with systolics in the 160s with diastolics  in the 90s but for the most part, they are actually on the lower side.   PHYSICAL EXAMINATION:  GENERAL:  Physical exam reveals a well-developed,  well-nourished elderly female who is alert.  Denies any complaints.  She  cannot provide any history secondary to her dementia.  VITAL SIGNS:  NECK:  No  adenopathy or JVD.  LUNGS:  Clear to auscultation.  Respiratory rate 18.  HEART:  Regular.  There is a small systolic murmur at the right sternal  border, radiating laterally, but no gallop is present.  ABDOMEN:  Soft and nontender.  EXTREMITIES:  Edema of the left lower extremity.  Right lower extremity  without edema.  She moves all extremities x4, but she does not always  consistently follow commands.  She can answer some simple questions but is  disoriented to time and place.  Verbal ability is she is fairly fluent, and  this is her baseline.  She has no reported skin rash, lesion, or breakdown.   ASSESSMENT/PLAN:  1.  Pulmonary embolus with no evidence of deep venous thrombosis on      ultrasound, raising the question of a pelvic source.  In any event, she      will be anticoagulated with the planned treatment of 6 months or      indefinitely.  Given her advanced age, no further evaluation is      indicated.  2.  Dementia with agitation.  Continue her usual regimen which she is      tolerating reasonably well.  3.   Hypertension by history.  We will monitor during the hospital stay.  4.  Osteoporosis.  She remains on calcium and vitamin D.  5.  Remote history of breast cancer, unlikely this is playing a role in her      current presentation.  She says this was many years ago, and she has no      evidence of acute disease, no further workup at this time unless further      indications.  The patient does have an advanced directive, no CPR,      mechanical ventilation which will be honored, but we will continue      aggressive supportive care.  She is listed as a full code which as I had      recalled, though, she and her husband had an advanced directive.  We      will review this.      Hanley Hays Josefine Class, M.D.  Electronically Signed     FED/MEDQ  D:  04/08/2006  T:  04/08/2006  Job:  [id number]

## 2011-04-15 NOTE — Procedures (Signed)
Yolanda [name], Yolanda [name]                ACCOUNT NO.:  [id number]   MEDICAL RECORD NO.:  [id number]           PATIENT TYPE:   LOCATION:                                 FACILITY:   PHYSICIAN:  Kasilof Bing, M.D.  DATE OF BIRTH:  1921/03/11   DATE OF PROCEDURE:  DATE OF DISCHARGE:                                EKG INTERPRETATION   1.  Normal sinus rhythm.  2.  Single premature ventricular contractions.  3.  Biatrial enlargement.  4.  Abnormal R wave progression/possible prior anterior myocardial      infarction.  5.  Left ventricular hypertrophy by voltage.  6.  Nonspecific ST-T wave abnormality.      RR/MEDQ  D:  09/14/2004  T:  09/14/2004  Job:  [id number]

## 2011-04-15 NOTE — Group Therapy Note (Signed)
Yolanda [name], Yolanda [name]                ACCOUNT NO.:  [id number]   MEDICAL RECORD NO.:  [id number]          PATIENT TYPE:  INP   LOCATION:  A318                          FACILITY:  [facility]   PHYSICIAN:  Mila Homer. Sudie Bailey, M.D.DATE OF BIRTH:  June 01, 1921   DATE OF PROCEDURE:  08/28/2005  DATE OF DISCHARGE:                                   PROGRESS NOTE   SUBJECTIVE:  She has no complaints today.  She had a good deal of pain in  the left hip when they tried to turn today, but she is comfortable now.   OBJECTIVE:  Temperature 98.9, pulse 81, respirations 20, blood pressure  120/64.  She is alert but disoriented.  She is in no acute distress.  She is  very thin, well-developed.  Lungs are clear throughout moving air well.  There are no intracostal contractions, use of accessory muscles on  respiration.  Heart has a regular rhythm rate of about 80.  Abdomen is soft  without tenderness, organomegaly or masses.  No edema of the feet.  She  seems to be wiggling her toes well.  Today, her white cell count is 7900,  H&H 10.4 and 37.3.  Met-7 normal.   ASSESSMENT:  1.  Status post left hip fracture with repair by Dr. Darreld Mclean      yesterday.  2.  Progressive dementia.   PLAN:  Continue current regimen.      Mila Homer. Sudie Bailey, M.D.  Electronically Signed     SDK/MEDQ  D:  08/28/2005  T:  08/29/2005  Job:  [id number]

## 2011-04-15 NOTE — Discharge Summary (Signed)
Yolanda [name], Yolanda [name]                ACCOUNT NO.:  [id number]   MEDICAL RECORD NO.:  [id number]          PATIENT TYPE:  INP   LOCATION:  A217                          FACILITY:  [facility]   PHYSICIAN:  Hanley Hays. Dechurch, M.D.DATE OF BIRTH:  1921/07/01   DATE OF ADMISSION:  04/08/2006  DATE OF DISCHARGE:  05/14/2007LH                                 DISCHARGE SUMMARY   DIAGNOSES:  1.  Acute pulmonary embolus, essentially asymptomatic.  2.  History of hip fracture, October 2006 with LLE edema, DVT negative by      doppler  3.  Severe dementia.  4.  Anemia, stable.  5.  Osteoporosis.  6.  Remote history of breast cancer.  7.  Remote history of left parieto-occipital lacunar infarct.  8.  History of hypertension.   DISPOSITION:  The patient is being discharged to [facility].   MEDICATIONS:  1.  Coumadin 7.5 mg tonight and then 5 mg nightly.  PT INR Apr 12, 2006.  2.  Namenda 10 mg b.i.d.  3.  Aricept 10 mg daily.  4.  Risperdal 1 mg at 8 a.m. and 1700 p.m.  5.  Multivitamin with minerals 1 daily.  6.  Celexa 20 mg daily.  7.  Calcium plus D 500 mg t.i.d.  8.  Lovenox 70 mg q.12 h. until INR greater than 2 times two successive      days.  9.  Klonopin 0.25 mg b.i.d.  10. Oxycodone 5 mg q.3 h. p.r.n. pain.   Regular diet with snacks t.i.d.  She will need a CBC and BMP on Apr 12, 2006.   FOLLOW UP:  By myself at the nursing facility.   HOSPITAL COURSE:  The patient is an 77 year old, African-American female,  who is a resident of [facility], where she has been stable with  the exception of dementia with combative and psychotic behaviors, who has a  remote history of left hip fracture.  She is essentially bed to chair.  She  was noted to have some increased edema in her left lower extremity.  D-dimer  was elevated at greater than 17.  Interestingly, Doppler exam revealed no  evidence of DVT in the left lower extremity, however, she had multiple PEs  noted on  CT scan most notable in the descending and segmental branches of  the right lower lobe.  She was actually very asymptomatic with this.  In any  event, she was admitted to the hospital for the institution of Lovenox and  Coumadin.  Aside from her dementia and being confused, she had no  complications.  She remained hemodynamically stable.  O2 saturations were  97% on room air.  She is being discharged back to the facility for ongoing  management.  She will continue her Lovenox until her INR is greater than 2  on two successive assessments and then Coumadin probably indefinitely.  No  further evaluation was entertained at this time.  At the time of discharge,  she is alert, pleasant, conversant and confused in no distress.  Respiratory  rate on room air  is about 12.  Her lungs are clear to auscultation  anteriorly and posteriorly.  The heart is regular.  I cannot appreciate a  murmur.  Abdomen is soft and nontender.  Extremities without clubbing or  cyanosis.  She has 2+ edema of the left lower extremity but no other  changes.  She moves all extremities x4.  Her mental status is baseline.      Hanley Hays Yolanda [name], M.D.  Electronically Signed     FED/MEDQ  D:  04/10/2006  T:  04/10/2006  Job:  [id number]

## 2011-04-15 NOTE — Group Therapy Note (Signed)
NAMEJOURDIN, GENS                ACCOUNT NO.:  [id number]   MEDICAL RECORD NO.:  [id number]          PATIENT TYPE:  INP   LOCATION:  A318                          FACILITY:  [facility]   PHYSICIAN:  Vickki Hearing, M.D.DATE OF BIRTH:  [birth year]   DATE OF PROCEDURE:  DATE OF DISCHARGE:                                   PROGRESS NOTE   She is postoperative day two status post a unipolar prosthetic replacement  of the left hip, doing well, in stable condition.  She should be up with  therapy today.  She can go home probably by tomorrow or the next day.  She  has a 24-hour caregiver.  She should receive therapy at home.  She will need  to be on some Lovenox for approximately 1 month.      Vickki Hearing, M.D.  Electronically Signed     SEH/MEDQ  D:  08/29/2005  T:  08/29/2005  Job:  [id number]

## 2011-04-15 NOTE — Group Therapy Note (Signed)
Yolanda [name], Yolanda [name]                ACCOUNT NO.:  [id number]   MEDICAL RECORD NO.:  [id number]          PATIENT TYPE:  INP   LOCATION:  A318                          FACILITY:  [facility]   PHYSICIAN:  Vickki Hearing, M.D.DATE OF BIRTH:  October 29, 1921   DATE OF PROCEDURE:  DATE OF DISCHARGE:                                   PROGRESS NOTE   Postop followup left hip unipolar replacement.  She is in stable condition.  Baseline mental status.  Tolerating some sitting up.  Is essentially bed-to-  chair.  Does reportedly walk maybe a little bit, and we can advance her  therapy as tolerated.  Discharged to home.  Orthopedic instructions are left  in the medical record.      Vickki Hearing, M.D.  Electronically Signed     SEH/MEDQ  D:  08/31/2005  T:  08/31/2005  Job:  [id number]

## 2011-04-15 NOTE — Group Therapy Note (Signed)
Yolanda [name], Yolanda [name]                ACCOUNT NO.:  [id number]   MEDICAL RECORD NO.:  [id number]          PATIENT TYPE:  INP   LOCATION:  A318                          FACILITY:  [facility]   PHYSICIAN:  Vickki Hearing, M.D.DATE OF BIRTH:  Aug 26, 1921   DATE OF PROCEDURE:  DATE OF DISCHARGE:                                   PROGRESS NOTE   Postop day one status post unipolar replacement of the left hip.  She is in  stable condition, stable vital signs, hemoglobin 10.8, she is doing well.  We will make discharge plans for her starting tomorrow.  She does have a 24-  hour care giver and will probably be able to go back home.      Vickki Hearing, M.D.  Electronically Signed     SEH/MEDQ  D:  08/28/2005  T:  08/28/2005  Job:  [id number]

## 2011-04-15 NOTE — H&P (Signed)
Yolanda, [name]                ACCOUNT NO.:  [id number]   MEDICAL RECORD NO.:  [id number]          PATIENT TYPE:  INP   LOCATION:  A318                          FACILITY:  [facility]   PHYSICIAN:  Yolanda Hays. [name], M.D.DATE OF BIRTH:  1921-06-30   DATE OF ADMISSION:  08/25/2005  DATE OF DISCHARGE:  LH                                HISTORY & PHYSICAL   HISTORY OF PRESENT ILLNESS:  The patient is an 77 year old, African-American  female who lives with her elderly husband and 24-hour caregivers who  apparently fell on Sunday, but today started complaining of increasing leg  pain.  She was seen in the office and sent for a left lower extremity x-ray  which reveals a left femoral neck fracture.  The patient was actually  complaining of weakness of the left lower extremity as well and underwent  MRI scan yesterday which revealed no acute findings aside from an old  lacunar and atrophy.  She has a past medical history of breast carcinoma  without recurrence and progressive Alzheimer's dementia.  She has no history  of coronary artery disease, CVA, diabetes, hypertension, etc.  She has not  been hospitalized in many, many years.  Information is obtained from her  husband who does have some cognitive impairment as well and her caregivers  know her quite intimately.   MEDICATIONS:  1.  Namenda 10 mg b.i.d.  2.  Aricept 10 at h.s.   ALLERGIES:  No known drug allergies.   SOCIAL HISTORY:  She is married.  She lives with her elderly husband who has  multiple medical problems currently getting ready to be placed on dialysis.  She has around the clock caregivers with no alcohol or tobacco abuse.  There  is a son and a daughter who live out of state.   REVIEW OF SYSTEMS:  CONSTITUTIONAL:  The patient is usually wheelchair  bound.  She transfers with assistance.  She walks short distances with  assistance only.  She has had some falls, but no injuries up until this  episode.  NEUROLOGIC:  She  sundowns and gets agitated at night which is not  unusual.  She occasionally has some anxiety, but for the most part has been  doing okay.  Caregiver has noted urinary frequency at night, no GI or GU  complaints.  GASTROINTESTINAL:  She has a fairly good appetite, although  thin her weight has been stable.   PHYSICAL EXAMINATION:  GENERAL:  A thin, elderly female.  She is complaining  of pain in her left leg, left knee and left chest wall.  She is in no  distress lying supine otherwise.  HEENT:  There is no JVD or adenopathy.  The oropharynx is moist.  Pupils are  equal, round and reactive to light.  LUNGS:  Clear to auscultation anterior and posterior.  HEART:  Regular, rate about 84.  I cannot appreciate a murmur or gallop.  ABDOMEN:  Flat, soft, no masses noted.  EXTREMITIES:  Without clubbing, cyanosis.  The left lower extremity is  externally rotated and shortened with some angulation at  the thigh, but no  bruising.  There is no skin rash, lesion or breakdown.  She is able to move  all extremities x4.  She can follow simple commands.  She clearly has short  term memory issues and is disoriented to time and place and situation.   ASSESSMENT/PLAN:  1.  Acute left femoral neck fracture, post fall.  2.  Moderate to severe dementia.  3.  Question chest pain, per patient.  Again, history is very limited      secondary to her severe dementia.  She has not been complaining of any      symptoms according to her spouse or staff.  We will get initial set of      cardiac enzymes and baseline electrocardiogram.  I am going to order an      echocardiogram as well.  She did have an abnormal electrocardiogram in      October 2005, and we will get the actual chart and review.  4.  She will need an orthopedics consult.  She has been placed in Buck's      traction at 5 pounds and pain medications have been instituted.  5.  Continue her usual medications and manage expectantly.      Yolanda Hays  Yolanda [name], M.D.  Electronically Signed     FED/MEDQ  D:  08/25/2005  T:  08/25/2005  Job:  [id number]

## 2011-04-15 NOTE — Group Therapy Note (Signed)
NAMEJHENE, WESTMORELAND                ACCOUNT NO.:  [id number]   MEDICAL RECORD NO.:  [id number]          PATIENT TYPE:  INP   LOCATION:  A318                          FACILITY:  [facility]   PHYSICIAN:  Mila Homer. Sudie Bailey, M.D.DATE OF BIRTH:  10-22-1921   DATE OF PROCEDURE:  DATE OF DISCHARGE:                                   PROGRESS NOTE   SUBJECTIVE:  An 77 year old incurred a fracture of the left hip which was  operated on by Dr. Hilda Lias this morning.  She is a resident of [facility].  Lives with her husband.  She is suffering progressive dementia over the last  10 years.  Even though I have taken care of her off and on over the years  before Dr. Josefine Class did, she fails to recognize me.   OBJECTIVE:  VITAL SIGNS: Temperature 98.2, pulse 91, respirations 20, blood  pressure 147/78.  GENERAL:  She is very thin, emaciated.  She is well-developed and she  appears to be alert although she is totally disoriented.  Her sentence  structure is intact.  There is no slurring of her speech.  LUNGS:  Clear throughout.  She is moving air well.  HEART:  Fairly regular rhythm and rate of 90.  ABDOMEN:  Soft.  EXTREMITIES:  She is wiggling her toes, left more than right.   LABORATORY DATA:  Hemoglobin 11.8, hematocrit 34.7. MET-7 normal.   ASSESSMENT:  1.  Status post hip fracture, now repaired by Dr. Darreld Mclean today.  2.  Progressive dementia.   PLAN:  Continue current treatments.  Follow up with Dr. Drenda Freeze DeChurch in two  days.      Mila Homer. Sudie Bailey, M.D.  Electronically Signed     SDK/MEDQ  D:  08/27/2005  T:  08/28/2005  Job:  [id number]

## 2014-03-28 ENCOUNTER — Encounter (HOSPITAL_COMMUNITY): Payer: Self-pay | Admitting: Emergency Medicine

## 2014-03-28 ENCOUNTER — Inpatient Hospital Stay (HOSPITAL_COMMUNITY)
Admission: EM | Admit: 2014-03-28 | Discharge: 2014-04-01 | DRG: 641 | Disposition: A | Discharge status: Skilled Nursing Facility | Payer: Medicare Other | Attending: Internal Medicine | Admitting: Internal Medicine

## 2014-03-28 ENCOUNTER — Emergency Department (HOSPITAL_COMMUNITY): Payer: Medicare Other

## 2014-03-28 DIAGNOSIS — E870 Hyperosmolality and hypernatremia: Principal | ICD-10-CM | POA: Diagnosis present

## 2014-03-28 DIAGNOSIS — I10 Essential (primary) hypertension: Secondary | ICD-10-CM | POA: Diagnosis present

## 2014-03-28 DIAGNOSIS — F411 Generalized anxiety disorder: Secondary | ICD-10-CM | POA: Diagnosis present

## 2014-03-28 DIAGNOSIS — D649 Anemia, unspecified: Secondary | ICD-10-CM | POA: Diagnosis present

## 2014-03-28 DIAGNOSIS — F419 Anxiety disorder, unspecified: Secondary | ICD-10-CM | POA: Diagnosis present

## 2014-03-28 DIAGNOSIS — E860 Dehydration: Secondary | ICD-10-CM

## 2014-03-28 DIAGNOSIS — Z66 Do not resuscitate: Secondary | ICD-10-CM | POA: Diagnosis present

## 2014-03-28 DIAGNOSIS — Z7401 Bed confinement status: Secondary | ICD-10-CM

## 2014-03-28 DIAGNOSIS — D696 Thrombocytopenia, unspecified: Secondary | ICD-10-CM | POA: Diagnosis present

## 2014-03-28 DIAGNOSIS — A498 Other bacterial infections of unspecified site: Secondary | ICD-10-CM | POA: Diagnosis present

## 2014-03-28 DIAGNOSIS — N390 Urinary tract infection, site not specified: Secondary | ICD-10-CM | POA: Diagnosis present

## 2014-03-28 DIAGNOSIS — Z681 Body mass index (BMI) 19 or less, adult: Secondary | ICD-10-CM

## 2014-03-28 DIAGNOSIS — Z853 Personal history of malignant neoplasm of breast: Secondary | ICD-10-CM

## 2014-03-28 DIAGNOSIS — E440 Moderate protein-calorie malnutrition: Secondary | ICD-10-CM | POA: Diagnosis present

## 2014-03-28 DIAGNOSIS — F0390 Unspecified dementia without behavioral disturbance: Secondary | ICD-10-CM

## 2014-03-28 HISTORY — DX: Major depressive disorder, single episode, unspecified: F32.9

## 2014-03-28 HISTORY — DX: Anxiety disorder, unspecified: F41.9

## 2014-03-28 HISTORY — DX: Dysphagia, unspecified: R13.10

## 2014-03-28 HISTORY — DX: Urinary tract infection, site not specified: N39.0

## 2014-03-28 HISTORY — DX: Essential (primary) hypertension: I10

## 2014-03-28 HISTORY — DX: Malignant (primary) neoplasm, unspecified: C80.1

## 2014-03-28 HISTORY — DX: Anemia, unspecified: D64.9

## 2014-03-28 HISTORY — DX: Unspecified dementia, unspecified severity, without behavioral disturbance, psychotic disturbance, mood disturbance, and anxiety: F03.90

## 2014-03-28 HISTORY — DX: Depression, unspecified: F32.A

## 2014-03-28 LAB — URINE MICROSCOPIC-ADD ON

## 2014-03-28 LAB — URINALYSIS, ROUTINE W REFLEX MICROSCOPIC
BILIRUBIN URINE: NEGATIVE
GLUCOSE, UA: NEGATIVE mg/dL
Ketones, ur: NEGATIVE mg/dL
Nitrite: NEGATIVE
Specific Gravity, Urine: 1.025 (ref 1.005–1.030)
UROBILINOGEN UA: 0.2 mg/dL (ref 0.0–1.0)
pH: 6 (ref 5.0–8.0)

## 2014-03-28 LAB — CBC WITH DIFFERENTIAL/PLATELET
BASOS ABS: 0 10*3/uL (ref 0.0–0.1)
Basophils Relative: 0 % (ref 0–1)
EOS PCT: 4 % (ref 0–5)
Eosinophils Absolute: 0.3 10*3/uL (ref 0.0–0.7)
HCT: 42.7 % (ref 36.0–46.0)
Hemoglobin: 13.1 g/dL (ref 12.0–15.0)
LYMPHS ABS: 1.9 10*3/uL (ref 0.7–4.0)
LYMPHS PCT: 28 % (ref 12–46)
MCH: 27.6 pg (ref 26.0–34.0)
MCHC: 30.7 g/dL (ref 30.0–36.0)
MCV: 89.9 fL (ref 78.0–100.0)
MONOS PCT: 8 % (ref 3–12)
Monocytes Absolute: 0.5 10*3/uL (ref 0.1–1.0)
NEUTROS PCT: 60 % (ref 43–77)
Neutro Abs: 4 10*3/uL (ref 1.7–7.7)
PLATELETS: 75 10*3/uL — AB (ref 150–400)
RBC: 4.75 MIL/uL (ref 3.87–5.11)
RDW: 16.1 % — ABNORMAL HIGH (ref 11.5–15.5)
SMEAR REVIEW: DECREASED
WBC: 6.7 10*3/uL (ref 4.0–10.5)

## 2014-03-28 LAB — COMPREHENSIVE METABOLIC PANEL
ALBUMIN: 3.2 g/dL — AB (ref 3.5–5.2)
ALK PHOS: 96 U/L (ref 39–117)
ALT: 12 U/L (ref 0–35)
AST: 20 U/L (ref 0–37)
BILIRUBIN TOTAL: 0.4 mg/dL (ref 0.3–1.2)
BUN: 26 mg/dL — ABNORMAL HIGH (ref 6–23)
CHLORIDE: 124 meq/L — AB (ref 96–112)
CO2: 28 meq/L (ref 19–32)
Calcium: 9.8 mg/dL (ref 8.4–10.5)
Creatinine, Ser: 1.09 mg/dL (ref 0.50–1.10)
GFR calc Af Amer: 50 mL/min — ABNORMAL LOW (ref >90)
GFR, EST NON AFRICAN AMERICAN: 43 mL/min — AB (ref >90)
Glucose, Bld: 85 mg/dL (ref 70–99)
POTASSIUM: 4.3 meq/L (ref 3.7–5.3)
SODIUM: 164 meq/L — AB (ref 137–147)
Total Protein: 7.4 g/dL (ref 6.0–8.3)

## 2014-03-28 LAB — BASIC METABOLIC PANEL
BUN: 25 mg/dL — AB (ref 6–23)
CO2: 28 meq/L (ref 19–32)
Calcium: 9.3 mg/dL (ref 8.4–10.5)
Chloride: 122 mEq/L — ABNORMAL HIGH (ref 96–112)
Creatinine, Ser: 1.09 mg/dL (ref 0.50–1.10)
GFR calc Af Amer: 50 mL/min — ABNORMAL LOW (ref >90)
GFR calc non Af Amer: 43 mL/min — ABNORMAL LOW (ref >90)
Glucose, Bld: 115 mg/dL — ABNORMAL HIGH (ref 70–99)
Potassium: 4.5 mEq/L (ref 3.7–5.3)
Sodium: 160 mEq/L — ABNORMAL HIGH (ref 137–147)

## 2014-03-28 LAB — TROPONIN I

## 2014-03-28 MED ORDER — CLONAZEPAM 0.5 MG PO TABS
0.2500 mg | ORAL_TABLET | Freq: Two times a day (BID) | ORAL | Status: DC | PRN
  Filled 2014-03-28: qty 1

## 2014-03-28 MED ORDER — MAGNESIUM HYDROXIDE 400 MG/5ML PO SUSP: 30.0000 mL | Freq: Every day | ORAL | Status: DC

## 2014-03-28 MED ORDER — CITALOPRAM HYDROBROMIDE 20 MG PO TABS
10.0000 mg | ORAL_TABLET | Freq: Every day | ORAL | Status: DC
  Administered 2014-03-29 – 2014-04-01 (×4): 10 mg via ORAL
  Filled 2014-03-28 (×4): qty 1

## 2014-03-28 MED ORDER — DEXTROSE 5 % IV SOLN
1.0000 g | Freq: Once | INTRAVENOUS | Status: AC
  Administered 2014-03-28: 1 g via INTRAVENOUS
  Filled 2014-03-28: qty 10

## 2014-03-28 MED ORDER — SODIUM CHLORIDE 0.9 % IV BOLUS (SEPSIS)
1000.0000 mL | Freq: Once | INTRAVENOUS | Status: AC
  Administered 2014-03-28: 1000 mL via INTRAVENOUS

## 2014-03-28 MED ORDER — ACETAMINOPHEN 650 MG RE SUPP: 650.0000 mg | Freq: Four times a day (QID) | RECTAL | Status: DC | PRN

## 2014-03-28 MED ORDER — ENOXAPARIN SODIUM 30 MG/0.3ML ~~LOC~~ SOLN: 30.0000 mg | SUBCUTANEOUS | Status: DC

## 2014-03-28 MED ORDER — MAGNESIUM HYDROXIDE 400 MG/5ML PO SUSP
30.0000 mL | Freq: Every day | ORAL | Status: DC
  Administered 2014-03-28 – 2014-03-31 (×4): 30 mL via ORAL
  Filled 2014-03-28 (×4): qty 30

## 2014-03-28 MED ORDER — DONEPEZIL HCL 5 MG PO TABS
10.0000 mg | ORAL_TABLET | Freq: Every day | ORAL | Status: DC
  Administered 2014-03-28 – 2014-03-31 (×4): 10 mg via ORAL
  Filled 2014-03-28 (×4): qty 2

## 2014-03-28 MED ORDER — BOOST / RESOURCE BREEZE PO LIQD
1.0000 | Freq: Two times a day (BID) | ORAL | Status: DC
  Administered 2014-03-29 – 2014-03-31 (×5): 1 via ORAL

## 2014-03-28 MED ORDER — DEXTROSE 5 % IV SOLN
1.0000 g | INTRAVENOUS | Status: DC
  Administered 2014-03-29 – 2014-04-01 (×4): 1 g via INTRAVENOUS
  Filled 2014-03-28 (×5): qty 10

## 2014-03-28 MED ORDER — CLONAZEPAM 0.5 MG PO TABS
0.2500 mg | ORAL_TABLET | Freq: Every day | ORAL | Status: DC
  Administered 2014-03-29 – 2014-04-01 (×4): 0.25 mg via ORAL
  Filled 2014-03-28 (×3): qty 1

## 2014-03-28 MED ORDER — ACETAMINOPHEN 325 MG PO TABS: 650.0000 mg | ORAL_TABLET | Freq: Four times a day (QID) | ORAL | Status: DC | PRN

## 2014-03-28 MED ORDER — DEXTROSE 5 % IV SOLN
INTRAVENOUS | Status: DC
  Administered 2014-03-28 – 2014-03-31 (×4): via INTRAVENOUS

## 2014-03-28 MED ORDER — MEMANTINE HCL 10 MG PO TABS
10.0000 mg | ORAL_TABLET | Freq: Two times a day (BID) | ORAL | Status: DC
  Administered 2014-03-28 – 2014-04-01 (×8): 10 mg via ORAL
  Filled 2014-03-28 (×8): qty 1

## 2014-03-28 MED ORDER — ONDANSETRON HCL 4 MG/2ML IJ SOLN: 4.0000 mg | Freq: Four times a day (QID) | INTRAMUSCULAR | Status: DC | PRN

## 2014-03-28 MED ORDER — ONDANSETRON HCL 4 MG PO TABS
4.0000 mg | ORAL_TABLET | Freq: Four times a day (QID) | ORAL | Status: DC | PRN
  Administered 2014-03-30: 4 mg via ORAL
  Filled 2014-03-28: qty 1

## 2014-03-28 NOTE — ED Notes (Signed)
Pt here from [place] for evaluation of abnormal labs. Also, pt is not eating or drinking

## 2014-03-28 NOTE — H&P (Signed)
Patient seen, independently examined and chart reviewed. I agree with exam, assessment and plan discussed with Dyanne Carrel, NP.  80 year old woman with advanced dementia who has stopped eating and drinking for at least the last several days. Lab work was checked at living facility and because of abnormalities, she was referred to the emergency department. EDP discussed with PCP Dr. Legrand Rams who requested admission.   Patient is nonverbal at baseline can offer no history.  In the emergency department noted to be afebrile with stable vital signs. Sodium 164, chloride 124. Creatinine stable. Troponin normal. Platelet count 75. Urinalysis was suggestive of infection. CT of the abdomen and pelvis demonstrated cholelithiasis, increased stool in the rectum. CT of the head unremarkable.  PMH Advanced dementia on Aricept and Namenda History of breast cancer  Objective: Afebrile, vital signs stable. No hypoxia. Gen. She appears calm, comfortable. She mumbles but does not follow commands. Appears nontoxic. Eyes appear grossly unremarkable although exam limited secondary to noncompliance. Lips appear dry and cracked. She would not open her mouth. Neck appears grossly unremarkable. Cardiovascular regular rate and rhythm. 2/6 systolic murmur. No rub or gallop. Respiratory clear to auscultation bilaterally. No wheezes, rales or rhonchi. Normal respiratory effort. Abdomen soft nontender nondistended. Left breast surgically absent. Right breast appears unremarkable. Skin appears grossly unremarkable. Feet warm and dry. Perfusion appears to be intact. Musculoskeletal grossly normal spontaneous movement. Neurologic exam grossly unremarkable.  80 year old woman presents with severe hypernatremia secondary to refusal to eat for at least several days, presumably secondary to advanced dementia. She has a legal guardian Fidela Juneau, I left a message to discuss care. Reported DO NOT RESUSCITATE status. Awaiting paperwork to  confirm this. Given advanced age and dementia DNR is appropriate.  Plan treatment of hypernatremia with gentle hydration, serial BMP to assess sputum correction. Treat urinary tract infection. Etiology and significance of thrombocytopenia is in stable. For now SCDs.  Murray Hodgkins, MD [facility] [phone]

## 2014-03-28 NOTE — ED Notes (Signed)
CRITICAL VALUE ALERT  Critical value received:  Sodium 164  Date of notification:  8315  Time of notification: 1761  Critical value read back:yes  Nurse who received alert:  Parthenia Ames  MD notified (1st page):  Dr. Roderic Palau  Time of first page:  1319  MD notified (2nd page):  Time of second page:  Responding MD:  Dr. Roderic Palau  Time MD responded: [phone]

## 2014-03-28 NOTE — ED Notes (Signed)
Pt has a DNR but no copy of form was with medical information upon admission. Mardene Celeste, from [place], aware and reported a staff member would bring a copy of pt's advanced directive to unit to put with pt medical information. Pt under legal guardianship. Spoke with Katrina from, Chief Financial Officer, regarding pt admission. Guardianship contact information as follows: [phone]. After Hours cell number is [phone]. Attempted to contact unit pt admitted to with no answer.

## 2014-03-28 NOTE — ED Notes (Signed)
Patient from [place]. Surveyor, quantity of nursing at bedside and aware of care plan. Reported that she would contact patient's guardian regarding patient admission.

## 2014-03-28 NOTE — ED Notes (Signed)
EDP aware and reported would consult PCP once all labs and scans have resulted concerning next plan of care and reported once initial normal saline bolus infuses to hang additional normal saline infusion at a rate of 250 ml/hr.

## 2014-03-28 NOTE — ED Notes (Signed)
Called unit that pt admitted to and left a message with secretary for pt's nurse concerning pervious note regarding DNR form.

## 2014-03-28 NOTE — Progress Notes (Signed)
ANTIBIOTIC CONSULT NOTE - INITIAL  Pharmacy Consult for Rocephin Indication: UTI  Allergies  Allergen Reactions  . Ace Inhibitors     Patient Measurements: Height: 6' (182.9 cm) Weight: 115 lb (52.164 kg) IBW/kg (Calculated) : 73.1  Vital Signs: Temp: 98.2 F (36.8 C) (05/01 1607) Temp src: Rectal (05/01 1607) BP: 134/60 mmHg (05/01 1600) Pulse Rate: 88 (05/01 1600) Intake/Output from previous day:   Intake/Output from this shift: Total I/O In: -  Out: 40 [Urine:40]  Labs:  Recent Labs  03/28/14 1226  WBC 6.7  HGB 13.1  PLT 75*  CREATININE 1.09   Estimated Creatinine Clearance: 27.7 ml/min (by C-G formula based on Cr of 1.09). No results found for this basename: VANCOTROUGH, VANCOPEAK, VANCORANDOM, GENTTROUGH, GENTPEAK, GENTRANDOM, TOBRATROUGH, TOBRAPEAK, TOBRARND, AMIKACINPEAK, AMIKACINTROU, AMIKACIN,  in the last 72 hours   Microbiology: No results found for this or any previous visit (from the past 720 hour(s)).  Medical History: Past Medical History  Diagnosis Date  . Anxiety   . Dysphagia   . Dementia   . Cancer   . Depression   . UTI (lower urinary tract infection)   . Anemia   . Hypertension     Medications:  Scheduled:  . [START ON 03/29/2014] cefTRIAXone (ROCEPHIN)  IV  1 g Intravenous Q24H  . [START ON 03/29/2014] citalopram  10 mg Oral Daily  . [START ON 03/29/2014] clonazePAM  0.25 mg Oral Daily  . donepezil  10 mg Oral QHS  . [START ON 03/29/2014] feeding supplement (RESOURCE BREEZE)  1 Container Oral BID BM  . magnesium hydroxide  30 mL Oral Daily  . memantine  10 mg Oral BID   Infusions:  . dextrose 75 mL/hr at 03/28/14 1754   Assessment: 80 yo F admitted from NH with decreased oral intake.  She is incontinent & non-communicative at baseline. Starting on empiric Rocephin for possible UTI.  Cx data pending.   Rocephin 5/1>>  Goal of Therapy:  Eradicate infection.  Plan:  Rocephin 1gm IV q24h F/U cx data & patient progress  Lavonia Drafts Brasen Bundren 03/28/2014,5:28 PM

## 2014-03-28 NOTE — H&P (Signed)
[facility] History and Physical  Yolanda [name] ZJQ:[id number] DOB: 1921/05/09 DOA: 03/28/2014  Referring physician:  PCP: Rosita Fire, MD   Chief Complaint: decrease po intake  HPI: Yolanda [name] is a 80 y.o. female with a past medical history of advanced dementia, anemia, hypertension, hyper nature anemia, anxiety present so the emergency department from the nursing facility with the chief complaint of decreased by mouth intake. I called the facility caregiver who indicated that over the last 3 days the patient has declined food and liquid. She indicates that the patient is normally "a good eater". There is no report of fever chills vomiting diarrhea or constipation. No report of cough shortness of breath or indication that the patient was any pain. Caregiver indicates that the patient's level of consciousness is at baseline but she is just not eating like she was. At baseline patient is noncommunicative and is incontinent and full assist. She is also bedbound.  Initial evaluation in the emergency department reveals hypernatremia with dehydration, thrombocytopenia and urinary tract infection. CT of the head without acute abnormality. CT of the abdomen Dependent atelectasis at lung bases. Tiny dependent calcified gallstones in gallbladder.  Small BILATERAL renal cysts, largest inferior pole LEFT kidney 2.5 x 2.5 cm image 38. Within limits of a nonenhanced exam, no additional focal abnormalities of the liver, spleen, pancreas, kidneys, or adrenal glands. Markedly increased stool in rectum, rectum 9.7 cm transverse.  At this time my exam she is alert she is hemodynamically stable she is afebrile and not hypoxic. In the emergency department she received 1 L of normal saline and a dose of Rocephin intravenously. We are asked to admit  Review of Systems:   Point review of systems completed with caregiver at nursing facility and all systems are negative except as indicated in the history of  present illness  Past Medical History  Diagnosis Date  . Anxiety   . Dysphagia   . Dementia   . Cancer   . Depression   . UTI (lower urinary tract infection)   . Anemia   . Hypertension    History reviewed. No pertinent past surgical history. Social History:  reports that she does not drink alcohol or use illicit drugs. Her tobacco history is not on file.  Allergies  Allergen Reactions  . Ace Inhibitors     No family history on file. unable to obtain at this time do to patient's advanced dementia  Prior to Admission medications   Medication Sig Start Date End Date Taking? Authorizing Provider  calcium-vitamin D (OSCAL WITH D) 500-200 MG-UNIT per tablet Take 1 tablet by mouth 2 (two) times daily.   Yes Historical Provider, MD  citalopram (CELEXA) 10 MG tablet Take 10 mg by mouth daily.   Yes Historical Provider, MD  clonazePAM (KLONOPIN) 0.5 MG tablet Take 0.25 mg by mouth 2 (two) times daily as needed for anxiety.   Yes Historical Provider, MD  clonazePAM (KLONOPIN) 0.5 MG tablet Take 0.25 mg by mouth daily.   Yes Historical Provider, MD  donepezil (ARICEPT) 10 MG tablet Take 10 mg by mouth at bedtime.   Yes Historical Provider, MD  feeding supplement, RESOURCE BREEZE, (RESOURCE BREEZE) LIQD Take 1 Container by mouth 2 (two) times daily between meals.   Yes Historical Provider, MD  memantine (NAMENDA) 10 MG tablet Take 10 mg by mouth 2 (two) times daily.   Yes Historical Provider, MD  Multiple Vitamins-Minerals (PRESERVISION AREDS PO) Take 1 tablet by mouth daily.   Yes Historical  Provider, MD   Physical Exam: Filed Vitals:   03/28/14 1430  BP: 129/76  Pulse: 88  Temp:   Resp:     BP 129/76  Pulse 88  Temp(Src) 97.3 F (36.3 C) (Oral)  Resp 15  Ht 6' (1.829 m)  Wt 52.164 kg (115 lb)  BMI 15.59 kg/m2  SpO2 99%  General:  Appears calm and comfortable Eyes: PERRL, normal lids, irises & conjunctiva ENT: Ears are clear nose without drainage oropharynx with dry mucous  membranes but pink. Lips are dry and cracked Neck: no LAD, masses or thyromegaly Cardiovascular: RRR, no m/r/g. No LE edema. Telemetry: SR, no arrhythmias  Respiratory: CTA bilaterally, no w/r/r. Normal respiratory effort somewhat shallow. Abdomen: soft, ntnd positive bowel sounds throughout. Slightly firm in lower quadrants Skin: no rash or induration seen on limited exam Musculoskeletal: Poor muscle tone. Joints without swelling/erythema Psychiatric: grossly normal mood and affect, speech fluent and appropriate Neurologic: . Patient alert nonverbal in meaningful way.. Able to make needs known. Unable to follow commands.           Labs on Admission:  Basic Metabolic Panel:  Recent Labs Lab 03/28/14 1226  NA 164*  K 4.3  CL 124*  CO2 28  GLUCOSE 85  BUN 26*  CREATININE 1.09  CALCIUM 9.8   Liver Function Tests:  Recent Labs Lab 03/28/14 1226  AST 20  ALT 12  ALKPHOS 96  BILITOT 0.4  PROT 7.4  ALBUMIN 3.2*   No results found for this basename: LIPASE, AMYLASE,  in the last 168 hours No results found for this basename: AMMONIA,  in the last 168 hours CBC:  Recent Labs Lab 03/28/14 1226  WBC 6.7  NEUTROABS 4.0  HGB 13.1  HCT 42.7  MCV 89.9  PLT 75*   Cardiac Enzymes:  Recent Labs Lab 03/28/14 1226  TROPONINI <0.30    BNP (last 3 results) No results found for this basename: PROBNP,  in the last 8760 hours CBG: No results found for this basename: GLUCAP,  in the last 168 hours  Radiological Exams on Admission: Ct Abdomen Pelvis Wo Contrast  03/28/2014   CLINICAL DATA:  Anorexia, history hypertension  EXAM: CT ABDOMEN AND PELVIS WITHOUT CONTRAST  TECHNIQUE: Multidetector CT imaging of the abdomen and pelvis was performed following the standard protocol without intravenous contrast. Sagittal and coronal MPR images reconstructed from axial data set.  COMPARISON:  None available; prior exam 07/11/2001 predates PACs and is no longer available for comparison.   FINDINGS: Dependent atelectasis at lung bases.  Tiny dependent calcified gallstones in gallbladder.  Small BILATERAL renal cysts, largest inferior pole LEFT kidney 2.5 x 2.5 cm image 38. .  Within limits of a nonenhanced exam, no additional focal abnormalities of the liver, spleen, pancreas, kidneys, or adrenal glands.  Markedly increased stool in rectum, rectum 9.7 cm transverse.  Normal appendix.  Stomach and bowel loops otherwise unremarkable.  Beam hardening artifacts from LEFT hip prosthesis obscure portions of pelvis.  Scattered atherosclerotic calcifications.  Foley catheter in urinary bladder, which is decompressed.  No mass, adenopathy, free fluid, or inflammatory process.  Bones demineralized.  Old appearing superior endplate compression fracture of L3.  IMPRESSION: Cholelithiasis.  Small BILATERAL renal cysts.  Markedly increased stool within rectum.   Electronically Signed   By: Lavonia Dana M.D.   On: 03/28/2014 14:00   Ct Head Wo Contrast  03/28/2014   CLINICAL DATA:  Decreased appetite  EXAM: CT HEAD WITHOUT CONTRAST  TECHNIQUE: Contiguous  axial images were obtained from the base of the skull through the vertex without intravenous contrast.  COMPARISON:  12/01/2008  FINDINGS: Severe global atrophy. This has progressed. Chronic ischemic changes. No mass effect, midline shift, or acute intracranial hemorrhage. Mastoid air cells are clear. Cranium is intact.  IMPRESSION: No acute intracranial pathology.   Electronically Signed   By: Maryclare Bean M.D.   On: 03/28/2014 13:52    EKG: Sinus rhythm  Assessment/Plan Principal Problem:   Acute hypernatremia: Likely related to dehydration as a result of decreased by mouth intake in the setting of urinary tract infection. Will admit to medical floor. Will provide gentle IV fluids with D5W . Will monitor sodium level closely. Chart review indicates patient with history of same several years ago but it appears her sodium level normalized. Active Problems:     Dehydration: Related to decreased by mouth intake presumably from urinary tract infection. Facility indicates patient is usually a good eater. Will provide dysphagia 3 diet. Will monitor intake and output.    UTI (lower urinary tract infection): Rocephin initiated in the emergency department. Will continue this. Will obtain urine cultures.   Thrombocytopenia. Platelet level LXXV. Etiology and significance unclear. Will monitor    Hypertension: Controlled.    Dementia: Appears to be stable at baseline.    Anxiety: Appears to be stable at baseline we'll continue home medication   Code Status: DNR Family Communication: facility caregiver Disposition Plan: back to facility  Time spent: 60 minutes  [facility]

## 2014-03-28 NOTE — ED Provider Notes (Signed)
CSN: [id number]     Arrival date & time 03/28/14  1144 History   This chart was scribed for Yolanda Diego, MD by Allena Earing, ED Scribe. This patient was seen in room APA02/APA02 and the patient's care was started at 12:13 PM .    Chief Complaint  Patient presents with  . Failure To Thrive      Patient is a 80 y.o. female presenting with altered mental status. The history is provided by the nursing home and a caregiver. No language interpreter was used.  Altered Mental Status Presenting symptoms: behavior changes, combativeness and confusion   Presenting symptoms comment:  Refusing to eat or drink  Severity:  Moderate Most recent episode:  Today Episode history:  Unable to specify Timing:  Intermittent Progression:  Worsening Chronicity:  Recurrent Context: dementia and nursing home resident   Associated symptoms: decreased appetite   Associated symptoms: no hallucinations, no rash and no seizures      HPI Comments: Yolanda [name] is a 80 y.o. female who presents to the Emergency Department as she is not eating or drinking anything at [facility]. Her caregiver states that she pushes the food out of her mouth and that "the pt is combative with the staff when they try to make her eat". She reports that the pt was previously a good eater.   LEVEL 5 CAVEAT: CONFUSION   Past Medical History  Diagnosis Date  . Anxiety   . Dysphagia   . Dementia   . Cancer   . Depression   . UTI (lower urinary tract infection)   . Anemia   . Hypertension    History reviewed. No pertinent past surgical history. No family history on file. History  Substance Use Topics  . Smoking status: Unknown If Ever Smoked  . Smokeless tobacco: Not on file  . Alcohol Use: No   OB History   Grav Para Term Preterm Abortions TAB SAB Ect Mult Living                 Review of Systems  Constitutional: Positive for activity change, appetite change and decreased appetite. Negative for fatigue.  HENT:  Negative for congestion, ear discharge and sinus pressure.   Eyes: Negative for discharge.  Respiratory: Negative for cough.   Gastrointestinal: Negative for diarrhea.  Genitourinary: Negative for frequency and hematuria.  Musculoskeletal: Negative for back pain.  Skin: Negative for rash.  Neurological: Negative for seizures.  Psychiatric/Behavioral: Positive for confusion. Negative for hallucinations.      Allergies  Ace inhibitors  Home Medications   Prior to Admission medications   Medication Sig Start Date End Date Taking? Authorizing Provider  calcium-vitamin D (OSCAL WITH D) 500-200 MG-UNIT per tablet Take 1 tablet by mouth 2 (two) times daily.   Yes Historical Provider, MD  citalopram (CELEXA) 10 MG tablet Take 10 mg by mouth daily.   Yes Historical Provider, MD  clonazePAM (KLONOPIN) 0.5 MG tablet Take 0.25 mg by mouth 2 (two) times daily as needed for anxiety.   Yes Historical Provider, MD  clonazePAM (KLONOPIN) 0.5 MG tablet Take 0.25 mg by mouth daily.   Yes Historical Provider, MD  donepezil (ARICEPT) 10 MG tablet Take 10 mg by mouth at bedtime.   Yes Historical Provider, MD  feeding supplement, RESOURCE BREEZE, (RESOURCE BREEZE) LIQD Take 1 Container by mouth 2 (two) times daily between meals.   Yes Historical Provider, MD  memantine (NAMENDA) 10 MG tablet Take 10 mg by mouth 2 (two)  times daily.   Yes Historical Provider, MD  Multiple Vitamins-Minerals (PRESERVISION AREDS PO) Take 1 tablet by mouth daily.   Yes Historical Provider, MD   There were no vitals taken for this visit. Physical Exam  Nursing note and vitals reviewed. Constitutional: She appears well-developed.  HENT:  Head: Normocephalic.  Dry mucous membranes   Eyes: Conjunctivae and EOM are normal. No scleral icterus.  Neck: Neck supple. No thyromegaly present.  Cardiovascular: Normal rate and regular rhythm.  Exam reveals no gallop and no friction rub.   No murmur heard. Pulmonary/Chest: No stridor.  She has no wheezes. She has no rales. She exhibits no tenderness.  Abdominal: She exhibits no distension. There is no tenderness. There is no rebound.  Musculoskeletal: Normal range of motion. She exhibits no edema.  Lymphadenopathy:    She has no cervical adenopathy.  Neurological: She is alert. She exhibits normal muscle tone. Coordination normal.  Not oriented to anything  Skin: No rash noted. No erythema.  Psychiatric: She has a normal mood and affect. Her behavior is normal.    ED Course  Procedures (including critical care time)  DIAGNOSTIC STUDIES:  COORDINATION OF CARE:   12:20 PM-Discussed treatment plan which includes CT, labs, EKG with pt at bedside and pt agreed to plan.   Labs Review Labs Reviewed  CBC WITH DIFFERENTIAL  COMPREHENSIVE METABOLIC PANEL  URINALYSIS, ROUTINE W REFLEX MICROSCOPIC  TROPONIN I    Imaging Review No results found.   EKG Interpretation None      MDM   Final diagnoses:  None    The chart was scribed for me under my direct supervision.  I personally performed the history, physical, and medical decision making and all procedures in the evaluation of this patient..   I personally performed the services described in this documentation, which was scribed in my presence. The recorded information has been reviewed and is accurate.    Yolanda Diego, MD 03/28/14 [phone]

## 2014-03-28 NOTE — ED Notes (Signed)
Attempted to report x1 with no answer.

## 2014-03-28 NOTE — ED Notes (Signed)
Normal saline bolus placed on IV pump at 750 ml/hr due to slow rate of infusion to gravity.

## 2014-03-29 LAB — CBC
HCT: 41.8 % (ref 36.0–46.0)
Hemoglobin: 12.8 g/dL (ref 12.0–15.0)
MCH: 27.3 pg (ref 26.0–34.0)
MCHC: 30.6 g/dL (ref 30.0–36.0)
MCV: 89.1 fL (ref 78.0–100.0)
Platelets: 57 10*3/uL — ABNORMAL LOW (ref 150–400)
RBC: 4.69 MIL/uL (ref 3.87–5.11)
RDW: 16.2 % — ABNORMAL HIGH (ref 11.5–15.5)
WBC: 6.1 10*3/uL (ref 4.0–10.5)

## 2014-03-29 LAB — BASIC METABOLIC PANEL
BUN: 24 mg/dL — ABNORMAL HIGH (ref 6–23)
BUN: 24 mg/dL — AB (ref 6–23)
CALCIUM: 8.7 mg/dL (ref 8.4–10.5)
CHLORIDE: 120 meq/L — AB (ref 96–112)
CO2: 25 meq/L (ref 19–32)
CO2: 24 mEq/L (ref 19–32)
CREATININE: 0.87 mg/dL (ref 0.50–1.10)
Calcium: 8.9 mg/dL (ref 8.4–10.5)
Chloride: 119 mEq/L — ABNORMAL HIGH (ref 96–112)
Creatinine, Ser: 0.91 mg/dL (ref 0.50–1.10)
GFR calc Af Amer: 62 mL/min — ABNORMAL LOW (ref >90)
GFR calc non Af Amer: 54 mL/min — ABNORMAL LOW (ref >90)
GFR calc non Af Amer: 57 mL/min — ABNORMAL LOW (ref >90)
GFR, EST AFRICAN AMERICAN: 65 mL/min — AB (ref >90)
GLUCOSE: 127 mg/dL — AB (ref 70–99)
Glucose, Bld: 103 mg/dL — ABNORMAL HIGH (ref 70–99)
POTASSIUM: 3.9 meq/L (ref 3.7–5.3)
Potassium: 3.4 mEq/L — ABNORMAL LOW (ref 3.7–5.3)
SODIUM: 154 meq/L — AB (ref 137–147)
Sodium: 155 mEq/L — ABNORMAL HIGH (ref 137–147)

## 2014-03-29 NOTE — Progress Notes (Signed)
M. Lynch(on call) notified of 8 pm blood draw sodium level of 160. No new orders.

## 2014-03-29 NOTE — Progress Notes (Signed)
Subjective: Patient was admitted yesterday from nursing home due to dehydration, hypernatremia and UTI. Patient is getting Iv antibiotics and IV fluid.  Objective: Vital signs in last 24 hours: Temp:  [97.3 F (36.3 C)-98.2 F (36.8 C)] 97.8 F (36.6 C) (05/02 0441) Pulse Rate:  [70-96] 70 (05/02 0441) Resp:  [14-19] 16 (05/02 0441) BP: (100-142)/(55-99) 120/80 mmHg (05/02 0441) SpO2:  [90 %-100 %] 99 % (05/02 0441) Weight:  [52.164 kg (115 lb)-52.2 kg (115 lb 1.3 oz)] 52.2 kg (115 lb 1.3 oz) (05/01 1800) Weight change:  Last BM Date: 03/29/14  Intake/Output from previous day: 05/01 0701 - 05/02 0700 In: 100 [P.O.:100] Out: 40 [Urine:40]  PHYSICAL EXAM General appearance: no distress and slowed mentation Resp: clear to auscultation bilaterally Cardio: S1, S2 normal GI: soft, non-tender; bowel sounds normal; no masses,  no organomegaly Extremities: extremities normal, atraumatic, no cyanosis or edema  Lab Results:    @labtest @ [facility] No results found for this basename: PHART, PCO2, PO2ART, TCO2, HCO3,  in the last 72 hours CULTURES No results found for this or any previous visit (from the past 240 hour(s)). Studies/Results: Ct Abdomen Pelvis Wo Contrast  03/28/2014   CLINICAL DATA:  Anorexia, history hypertension  EXAM: CT ABDOMEN AND PELVIS WITHOUT CONTRAST  TECHNIQUE: Multidetector CT imaging of the abdomen and pelvis was performed following the standard protocol without intravenous contrast. Sagittal and coronal MPR images reconstructed from axial data set.  COMPARISON:  None available; prior exam 07/11/2001 predates PACs and is no longer available for comparison.  FINDINGS: Dependent atelectasis at lung bases.  Tiny dependent calcified gallstones in gallbladder.  Small BILATERAL renal cysts, largest inferior pole LEFT kidney 2.5 x 2.5 cm image 38. .  Within limits of a nonenhanced exam, no additional focal abnormalities of the liver, spleen, pancreas, kidneys, or adrenal  glands.  Markedly increased stool in rectum, rectum 9.7 cm transverse.  Normal appendix.  Stomach and bowel loops otherwise unremarkable.  Beam hardening artifacts from LEFT hip prosthesis obscure portions of pelvis.  Scattered atherosclerotic calcifications.  Foley catheter in urinary bladder, which is decompressed.  No mass, adenopathy, free fluid, or inflammatory process.  Bones demineralized.  Old appearing superior endplate compression fracture of L3.  IMPRESSION: Cholelithiasis.  Small BILATERAL renal cysts.  Markedly increased stool within rectum.   Electronically Signed   By: Lavonia Dana M.D.   On: 03/28/2014 14:00   Ct Head Wo Contrast  03/28/2014   CLINICAL DATA:  Decreased appetite  EXAM: CT HEAD WITHOUT CONTRAST  TECHNIQUE: Contiguous axial images were obtained from the base of the skull through the vertex without intravenous contrast.  COMPARISON:  12/01/2008  FINDINGS: Severe global atrophy. This has progressed. Chronic ischemic changes. No mass effect, midline shift, or acute intracranial hemorrhage. Mastoid air cells are clear. Cranium is intact.  IMPRESSION: No acute intracranial pathology.   Electronically Signed   By: Maryclare Bean M.D.   On: 03/28/2014 13:52    Medications: I have reviewed the patient's current medications.  Assesment: Principal Problem:   Acute hypernatremia Active Problems:   Dehydration   UTI (lower urinary tract infection)   Anemia   Hypertension   Dementia   Anxiety   Hypernatremia    Plan: Medications reviewed Will increase IV fluid to 100 cc/hr Will continue iv antibiotics Supportive care    LOS: 1 day   Tephanie Escorcia 03/29/2014, 9:58 AM

## 2014-03-30 ENCOUNTER — Encounter (HOSPITAL_COMMUNITY): Payer: Self-pay | Admitting: General Practice

## 2014-03-30 LAB — BASIC METABOLIC PANEL
BUN: 17 mg/dL (ref 6–23)
CALCIUM: 8.4 mg/dL (ref 8.4–10.5)
CO2: 25 mEq/L (ref 19–32)
Chloride: 108 mEq/L (ref 96–112)
Creatinine, Ser: 0.86 mg/dL (ref 0.50–1.10)
GFR calc Af Amer: 66 mL/min — ABNORMAL LOW (ref >90)
GFR calc non Af Amer: 57 mL/min — ABNORMAL LOW (ref >90)
GLUCOSE: 95 mg/dL (ref 70–99)
POTASSIUM: 3.7 meq/L (ref 3.7–5.3)
Sodium: 144 mEq/L (ref 137–147)

## 2014-03-30 NOTE — Progress Notes (Signed)
Subjective: Patient is opening her eyes. Her po intake remained poor. No nausea or vomiting.  Objective: Vital signs in last 24 hours: Temp:  [97.3 F (36.3 C)-98.5 F (36.9 C)] 97.3 F (36.3 C) (05/03 0416) Pulse Rate:  [83-88] 87 (05/03 0416) Resp:  [16] 16 (05/03 0416) BP: (100-130)/(59-78) 100/78 mmHg (05/03 0416) SpO2:  [99 %-100 %] 99 % (05/03 0416) Weight change:  Last BM Date: 03/29/14  Intake/Output from previous day: 05/02 0701 - 05/03 0700 In: 3125 [P.O.:680; I.V.:2445] Out: 550 [Urine:550]  PHYSICAL EXAM General appearance: no distress and slowed mentation Resp: clear to auscultation bilaterally Cardio: S1, S2 normal GI: soft, non-tender; bowel sounds normal; no masses,  no organomegaly Extremities: extremities normal, atraumatic, no cyanosis or edema  Lab Results:    @labtest @ [facility] No results found for this basename: PHART, PCO2, PO2ART, TCO2, HCO3,  in the last 72 hours CULTURES No results found for this or any previous visit (from the past 240 hour(s)). Studies/Results: Ct Abdomen Pelvis Wo Contrast  03/28/2014   CLINICAL DATA:  Anorexia, history hypertension  EXAM: CT ABDOMEN AND PELVIS WITHOUT CONTRAST  TECHNIQUE: Multidetector CT imaging of the abdomen and pelvis was performed following the standard protocol without intravenous contrast. Sagittal and coronal MPR images reconstructed from axial data set.  COMPARISON:  None available; prior exam 07/11/2001 predates PACs and is no longer available for comparison.  FINDINGS: Dependent atelectasis at lung bases.  Tiny dependent calcified gallstones in gallbladder.  Small BILATERAL renal cysts, largest inferior pole LEFT kidney 2.5 x 2.5 cm image 38. .  Within limits of a nonenhanced exam, no additional focal abnormalities of the liver, spleen, pancreas, kidneys, or adrenal glands.  Markedly increased stool in rectum, rectum 9.7 cm transverse.  Normal appendix.  Stomach and bowel loops otherwise unremarkable.  Beam  hardening artifacts from LEFT hip prosthesis obscure portions of pelvis.  Scattered atherosclerotic calcifications.  Foley catheter in urinary bladder, which is decompressed.  No mass, adenopathy, free fluid, or inflammatory process.  Bones demineralized.  Old appearing superior endplate compression fracture of L3.  IMPRESSION: Cholelithiasis.  Small BILATERAL renal cysts.  Markedly increased stool within rectum.   Electronically Signed   By: Lavonia Dana M.D.   On: 03/28/2014 14:00   Ct Head Wo Contrast  03/28/2014   CLINICAL DATA:  Decreased appetite  EXAM: CT HEAD WITHOUT CONTRAST  TECHNIQUE: Contiguous axial images were obtained from the base of the skull through the vertex without intravenous contrast.  COMPARISON:  12/01/2008  FINDINGS: Severe global atrophy. This has progressed. Chronic ischemic changes. No mass effect, midline shift, or acute intracranial hemorrhage. Mastoid air cells are clear. Cranium is intact.  IMPRESSION: No acute intracranial pathology.   Electronically Signed   By: Maryclare Bean M.D.   On: 03/28/2014 13:52    Medications: I have reviewed the patient's current medications.  Assesment: Principal Problem:   Acute hypernatremia Active Problems:   Dehydration   UTI (lower urinary tract infection)   Anemia   Hypertension   Dementia   Anxiety   Hypernatremia    Plan: Medications reviewed Continue  IV fluid 75 cc/hr Will continue iv antibiotics Supportive care    LOS: 2 days   Willma Obando 03/30/2014, 9:31 AM

## 2014-03-31 LAB — BASIC METABOLIC PANEL
BUN: 14 mg/dL (ref 6–23)
CHLORIDE: 108 meq/L (ref 96–112)
CO2: 27 mEq/L (ref 19–32)
CREATININE: 0.93 mg/dL (ref 0.50–1.10)
Calcium: 8.3 mg/dL — ABNORMAL LOW (ref 8.4–10.5)
GFR calc Af Amer: 60 mL/min — ABNORMAL LOW (ref >90)
GFR calc non Af Amer: 52 mL/min — ABNORMAL LOW (ref >90)
Glucose, Bld: 80 mg/dL (ref 70–99)
Potassium: 4.1 mEq/L (ref 3.7–5.3)
Sodium: 144 mEq/L (ref 137–147)

## 2014-03-31 NOTE — Progress Notes (Signed)
Subjective: Patient is more alert and awake. Her po intake remained poor. She is growing resistant strain of E.coli.  Objective: Vital signs in last 24 hours: Temp:  [96.4 F (35.8 C)-98.6 F (37 C)] 98.6 F (37 C) (05/04 0411) Pulse Rate:  [82-97] 97 (05/04 0411) Resp:  [16] 16 (05/04 0411) BP: (100-133)/(61-68) 133/63 mmHg (05/04 0411) SpO2:  [97 %-98 %] 97 % (05/04 0411) Weight change:  Last BM Date: 03/30/14  Intake/Output from previous day: 05/03 0701 - 05/04 0700 In: 2389.2 [P.O.:560; I.V.:1779.2; IV Piggyback:50] Out: 1250 [Urine:1250]  PHYSICAL EXAM General appearance: no distress and slowed mentation Resp: clear to auscultation bilaterally Cardio: S1, S2 normal GI: soft, non-tender; bowel sounds normal; no masses,  no organomegaly Extremities: extremities normal, atraumatic, no cyanosis or edema  Lab Results:    @labtest @ [facility] No results found for this basename: PHART, PCO2, PO2ART, TCO2, HCO3,  in the last 72 hours CULTURES No results found for this or any previous visit (from the past 240 hour(s)). Studies/Results: No results found.  Medications: I have reviewed the patient's current medications.  Assesment: Principal Problem:   Acute hypernatremia Active Problems:   Dehydration   UTI (lower urinary tract infection)   Anemia   Hypertension   Dementia   Anxiety   Hypernatremia    Plan: Medications reviewed Continue  IV fluid 75 cc/hr Will continue iv antibiotics Supportive care    LOS: 3 days   Sricharan Lacomb 03/31/2014, 8:14 AM

## 2014-03-31 NOTE — Care Management Utilization Note (Signed)
UR completed 

## 2014-03-31 NOTE — Progress Notes (Signed)
[facility] for Rocephin Indication: UTI  Allergies  Allergen Reactions  . Ace Inhibitors     Patient Measurements: Height: 5\' 5"  (165.1 cm) Weight: 115 lb 1.3 oz (52.2 kg) IBW/kg (Calculated) : 57  Vital Signs: Temp: 98.6 F (37 C) (05/04 0411) Temp src: Oral (05/04 0411) BP: 133/63 mmHg (05/04 0411) Pulse Rate: 97 (05/04 0411) Intake/Output from previous day: 05/03 0701 - 05/04 0700 In: 2389.2 [P.O.:560; I.V.:1779.2; IV Piggyback:50] Out: 1250 [Urine:1250] Intake/Output from this shift:    Labs:  Recent Labs  03/28/14 1226  03/29/14 0156 03/29/14 0805 03/30/14 0604 03/31/14 0530  WBC 6.7  --  6.1  --   --   --   HGB 13.1  --  12.8  --   --   --   PLT 75*  --  57*  --   --   --   CREATININE 1.09  < > 0.91 0.87 0.86 0.93  < > = values in this interval not displayed. Estimated Creatinine Clearance: 32.5 ml/min (by C-G formula based on Cr of 0.93). No results found for this basename: VANCOTROUGH, VANCOPEAK, VANCORANDOM, GENTTROUGH, GENTPEAK, GENTRANDOM, TOBRATROUGH, TOBRAPEAK, TOBRARND, AMIKACINPEAK, AMIKACINTROU, AMIKACIN,  in the last 72 hours   Microbiology: No results found for this or any previous visit (from the past 720 hour(s)).  Medical History: Past Medical History  Diagnosis Date  . Anxiety   . Dysphagia   . Dementia   . Cancer   . Depression   . UTI (lower urinary tract infection)   . Anemia   . Hypertension     Medications:  Scheduled:  . cefTRIAXone (ROCEPHIN)  IV  1 g Intravenous Q24H  . citalopram  10 mg Oral Daily  . clonazePAM  0.25 mg Oral Daily  . donepezil  10 mg Oral QHS  . feeding supplement (RESOURCE BREEZE)  1 Container Oral BID BM  . magnesium hydroxide  30 mL Oral Daily  . memantine  10 mg Oral BID   Infusions:  . dextrose 75 mL/hr at 03/30/14 1425   Assessment: 80 yo F admitted from NH with decreased oral intake.  She is incontinent & non-communicative at baseline. Starting on empiric  Rocephin for possible UTI.  No Cx data collected this admission (Hx of Ecoli UTI-sens Rocephin in 2010).   She is clinically improved per report.   Rocephin 5/1>>  Goal of Therapy:  Eradicate infection.  Plan:  Rocephin 1gm IV q24h Pharmacy to sign off Duration of therapy per MD- recommend no more than 7 days of antibiotics  Lavonia Drafts Hershel Corkery 03/31/2014,8:23 AM

## 2014-03-31 NOTE — Progress Notes (Signed)
INITIAL NUTRITION ASSESSMENT  DOCUMENTATION CODES Per approved criteria  -Non-severe (moderate) malnutrition in the context of chronic illness   INTERVENTION: Magic cup TID with meals, each supplement provides 290 kcal and 9 grams of protein  Assistance with feeding   During meals recommend: minimize distractions, verbalize each step of eating process, describe each food item and offer small bites  NUTRITION DIAGNOSIS: Inadequate oral intake related to acute infection and advanced dementia as evidenced by pt hx decreased po  and dehydration, UTI. Hx of Advanced dementia.  Goal: Meet nutrition needs as able. Aggressive nutrition measures not recommended given her advanced dementia.  Monitor:  Po intake, labs and wt trends   Reason for Assessment: Braden Score=12  80 y.o. female  Admitting Dx: Acute hypernatremia  ASSESSMENT: Pt unable to provide hx. Very poor po intake and dehydrated on admission. She also has UTI and advanced dementia.  Her nurse tech is present and reported breakfast intake 100% grits, juice and milk. Fed by staff.   Pt meets criteria for  Moderate MALNUTRITION in the context of chronic illness as evidenced by advanced dementia, mild depletion of fat and mucle.   Nutrition Focused Physical Exam:  Subcutaneous Fat:  Orbital Region: mild depletion Upper Arm Region: WDL Thoracic and Lumbar Region: not assessed  Muscle:  Temple Region: mild wasting Clavicle Bone Region: moderate wasting Clavicle and Acromion Bone Region: WDL Scapular Bone Region: not assessed Dorsal Hand: Mild wasting Patellar Region: WDL Anterior Thigh Region: WDL Posterior Calf Region: SCD's present  Edema: mild      Height: Ht Readings from Last 1 Encounters:  03/28/14 5\' 5"  (1.651 m)    Weight: Wt Readings from Last 1 Encounters:  03/28/14 115 lb 1.3 oz (52.2 kg)    Ideal Body Weight: 125# (56.8kg)  % Ideal Body Weight: 92%  Wt Readings from Last 10 Encounters:   03/28/14 115 lb 1.3 oz (52.2 kg)    Usual Body Weight: unknown  % Usual Body Weight: ----  BMI:  Body mass index is 19.15 kg/(m^2). within normal range  Estimated Nutritional Needs: Kcal: 1500-1700 Protein: 70-80 gr Fluid: 1.5-1.7 liters daily  Skin: Stage I to left buttock, healed ulcer to sacrum  Diet Order: Dysphagia 3 with thin liquids (po's 50-100%)  EDUCATION NEEDS: -Education not appropriate at this time   Intake/Output Summary (Last 24 hours) at 03/31/14 1403 Last data filed at 03/31/14 0856  Gross per 24 hour  Intake 2309.17 ml  Output   1200 ml  Net 1109.17 ml    Last BM: 03/30/14  Labs:   Recent Labs Lab 03/29/14 0805 03/30/14 0604 03/31/14 0530  NA 155* 144 144  K 3.4* 3.7 4.1  CL 120* 108 108  CO2 24 25 27   BUN 24* 17 14  CREATININE 0.87 0.86 0.93  CALCIUM 8.7 8.4 8.3*  GLUCOSE 103* 95 80    CBG (last 3)  No results found for this basename: GLUCAP,  in the last 72 hours  Scheduled Meds: . cefTRIAXone (ROCEPHIN)  IV  1 g Intravenous Q24H  . citalopram  10 mg Oral Daily  . clonazePAM  0.25 mg Oral Daily  . donepezil  10 mg Oral QHS  . feeding supplement (RESOURCE BREEZE)  1 Container Oral BID BM  . magnesium hydroxide  30 mL Oral Daily  . memantine  10 mg Oral BID    Continuous Infusions: . dextrose 75 mL/hr at 03/30/14 1425    Past Medical History  Diagnosis Date  .  Anxiety   . Dysphagia   . Dementia   . Cancer   . Depression   . UTI (lower urinary tract infection)   . Anemia   . Hypertension     History reviewed. No pertinent past surgical history.  Colman Cater MS,RD,CSG,LDN Office: [phone] Pager: [phone]

## 2014-03-31 NOTE — Care Management Note (Unsigned)
    Page 1 of 1   03/31/2014     5:03:55 PM CARE MANAGEMENT NOTE 03/31/2014  Patient:  Yolanda [name], Yolanda [name]   Account Number:  [id number]  Date Initiated:  03/31/2014  Documentation initiated by:  Vladimir Creeks  Subjective/Objective Assessment:   Admitted with UTI, and not eating, dehydration. pt is from [facility].She will return there at D/C.     Action/Plan:   CSW following   Anticipated DC Date:  04/01/2014   Anticipated DC Plan:  SKILLED NURSING FACILITY  In-house referral  Clinical Social Worker      DC Planning Services  CM consult      Choice offered to / List presented to:             Status of service:  In process, will continue to follow Medicare Important Message given?   (If response is "NO", the following Medicare IM given date fields will be blank) Date Medicare IM given:   Date Additional Medicare IM given:    Discharge Disposition:    Per UR Regulation:  Reviewed for med. necessity/level of care/duration of stay  If discussed at Long Length of Stay Meetings, dates discussed:    Comments:  03/31/14/ Maxwell RN/CM

## 2014-03-31 NOTE — Clinical Documentation Improvement (Signed)
Possible Clinical Conditions? Severe Malnutrition   Protein Calorie Malnutrition Severe Protein Calorie Malnutrition Other Condition Supporting Information: Risk Factors:dementia, uti Signs & Symptoms: dehydration, hypernatremia, failure to thrive, low bmi Diagnostics:bmi: 15.59 Treatment:IVFs, monitor, evaluate, treat  Thank You, Joya Salm ,RN Clinical Documentation Specialist:  [place] Information Management

## 2014-04-01 DIAGNOSIS — E440 Moderate protein-calorie malnutrition: Secondary | ICD-10-CM | POA: Insufficient documentation

## 2014-04-01 LAB — BASIC METABOLIC PANEL
BUN: 11 mg/dL (ref 6–23)
CALCIUM: 8.3 mg/dL — AB (ref 8.4–10.5)
CO2: 27 meq/L (ref 19–32)
CREATININE: 0.85 mg/dL (ref 0.50–1.10)
Chloride: 106 mEq/L (ref 96–112)
GFR calc Af Amer: 67 mL/min — ABNORMAL LOW (ref >90)
GFR calc non Af Amer: 58 mL/min — ABNORMAL LOW (ref >90)
Glucose, Bld: 91 mg/dL (ref 70–99)
Potassium: 3.9 mEq/L (ref 3.7–5.3)
Sodium: 142 mEq/L (ref 137–147)

## 2014-04-01 LAB — CBC
HCT: 39.1 % (ref 36.0–46.0)
Hemoglobin: 12.6 g/dL (ref 12.0–15.0)
MCH: 27.9 pg (ref 26.0–34.0)
MCHC: 32.2 g/dL (ref 30.0–36.0)
MCV: 86.7 fL (ref 78.0–100.0)
PLATELETS: 71 10*3/uL — AB (ref 150–400)
RBC: 4.51 MIL/uL (ref 3.87–5.11)
RDW: 15.6 % — AB (ref 11.5–15.5)
WBC: 5.1 10*3/uL (ref 4.0–10.5)

## 2014-04-01 MED ORDER — DEXTROSE 5 % IV SOLN: 1.0000 g | INTRAVENOUS | Status: AC

## 2014-04-01 NOTE — Progress Notes (Signed)
Report given to [facility] nurse. IV site restarted so patient could continue antibiotic therapy. Patient voiding post foley.

## 2014-04-01 NOTE — Discharge Summary (Signed)
Physician Discharge Summary  Patient ID: Yolanda [name] MRN: [id number] DOB/AGE: February 24, 1921 80 y.o. Primary Care Physician:Autym Siess, MD Admit date: 03/28/2014 Discharge date: 04/01/2014    Discharge Diagnoses:   Principal Problem:   Acute hypernatremia Active Problems:   Dehydration   UTI (lower urinary tract infection)   Anemia   Hypertension   Dementia   Anxiety   Hypernatremia   Malnutrition of moderate degree     Medication List         calcium-vitamin D 500-200 MG-UNIT per tablet  Commonly known as:  OSCAL WITH D  Take 1 tablet by mouth 2 (two) times daily.     cefTRIAXone 1 g in dextrose 5 % 50 mL  Inject 1 g into the vein daily.     citalopram 10 MG tablet  Commonly known as:  CELEXA  Take 10 mg by mouth daily.     clonazePAM 0.5 MG tablet  Commonly known as:  KLONOPIN  Take 0.25 mg by mouth 2 (two) times daily as needed for anxiety.     clonazePAM 0.5 MG tablet  Commonly known as:  KLONOPIN  Take 0.25 mg by mouth daily.     donepezil 10 MG tablet  Commonly known as:  ARICEPT  Take 10 mg by mouth at bedtime.     feeding supplement (RESOURCE BREEZE) Liqd  Take 1 Container by mouth 2 (two) times daily between meals.     memantine 10 MG tablet  Commonly known as:  NAMENDA  Take 10 mg by mouth 2 (two) times daily.     PRESERVISION AREDS PO  Take 1 tablet by mouth daily.        Discharged Condition: improved    Consults: none  Significant Diagnostic Studies: Ct Abdomen Pelvis Wo Contrast  03/28/2014   CLINICAL DATA:  Anorexia, history hypertension  EXAM: CT ABDOMEN AND PELVIS WITHOUT CONTRAST  TECHNIQUE: Multidetector CT imaging of the abdomen and pelvis was performed following the standard protocol without intravenous contrast. Sagittal and coronal MPR images reconstructed from axial data set.  COMPARISON:  None available; prior exam 07/11/2001 predates PACs and is no longer available for comparison.  FINDINGS: Dependent atelectasis at lung  bases.  Tiny dependent calcified gallstones in gallbladder.  Small BILATERAL renal cysts, largest inferior pole LEFT kidney 2.5 x 2.5 cm image 38. .  Within limits of a nonenhanced exam, no additional focal abnormalities of the liver, spleen, pancreas, kidneys, or adrenal glands.  Markedly increased stool in rectum, rectum 9.7 cm transverse.  Normal appendix.  Stomach and bowel loops otherwise unremarkable.  Beam hardening artifacts from LEFT hip prosthesis obscure portions of pelvis.  Scattered atherosclerotic calcifications.  Foley catheter in urinary bladder, which is decompressed.  No mass, adenopathy, free fluid, or inflammatory process.  Bones demineralized.  Old appearing superior endplate compression fracture of L3.  IMPRESSION: Cholelithiasis.  Small BILATERAL renal cysts.  Markedly increased stool within rectum.   Electronically Signed   By: Lavonia Dana M.D.   On: 03/28/2014 14:00   Ct Head Wo Contrast  03/28/2014   CLINICAL DATA:  Decreased appetite  EXAM: CT HEAD WITHOUT CONTRAST  TECHNIQUE: Contiguous axial images were obtained from the base of the skull through the vertex without intravenous contrast.  COMPARISON:  12/01/2008  FINDINGS: Severe global atrophy. This has progressed. Chronic ischemic changes. No mass effect, midline shift, or acute intracranial hemorrhage. Mastoid air cells are clear. Cranium is intact.  IMPRESSION: No acute intracranial pathology.   Electronically Signed  By: Maryclare Bean M.D.   On: 03/28/2014 13:52    Lab Results: Basic Metabolic Panel:  Recent Labs  03/31/14 0530 04/01/14 0503  NA 144 142  K 4.1 3.9  CL 108 106  CO2 27 27  GLUCOSE 80 91  BUN 14 11  CREATININE 0.93 0.85  CALCIUM 8.3* 8.3*   Liver Function Tests: No results found for this basename: AST, ALT, ALKPHOS, BILITOT, PROT, ALBUMIN,  in the last 72 hours   CBC:  Recent Labs  04/01/14 0503  WBC 5.1  HGB 12.6  HCT 39.1  MCV 86.7  PLT 71*    No results found for this or any previous  visit (from the past 240 hour(s)).   Hospital Course:  This is a 80 years old female patient with history of multiple medical illnesses who was a resident of [name] admitted due to hyrnatremia, dehydration and severe malnutrition. Patient was admitted and was rehydrated. Patient was also found to have UTI. Urine culture grew resistant strain of E.coli which is sensitive to ceftriaxone. Patient will be discharge to nursing home on the antibiotic to be completed in 5 days.  Discharge Exam: Blood pressure 100/64, pulse 96, temperature 98.3 F (36.8 C), temperature source Axillary, resp. rate 20, height 5\' 5"  (1.651 m), weight 52.2 kg (115 lb 1.3 oz), SpO2 99.00%.   Disposition:  Nursing home      Signed: Rosita Fire   04/01/2014, 8:33 AM

## 2014-04-01 NOTE — Clinical Social Work Psychosocial (Signed)
    Clinical Social Work Department BRIEF PSYCHOSOCIAL ASSESSMENT 04/01/2014  Patient:  Yolanda [name], Yolanda [name]     Account Number:  [id number]     Admit date:  03/28/2014  Clinical Social Worker:  Edwyna Shell, [place]  Date/Time:  04/01/2014 01:00 PM  Referred by:  CSW  Date Referred:  04/01/2014 Referred for  SNF Placement   Other Referral:   Interview type:  Other - See comment Other interview type:   spoke w [employer] guardian    PSYCHOSOCIAL DATA Living Status:  FACILITY Admitted from facility:  [facility] Level of care:  [place] Primary support name:  [employer] Guardianship Primary support relationship to patient:  NONE Degree of support available:   Has legal guardian assigned.    CURRENT CONCERNS Current Concerns  Post-Acute Placement   Other Concerns:    SOCIAL WORK ASSESSMENT / PLAN CSW unable to assess patient directly, patient is not oriented.  Spoke w [employer] guardian who visits patient regularly.  Confirms that patient is resident of [facility] and that they would like her to return at discharge. Patient is nonverbal at baseline and has advanced dementia. She has refused to eat or drink for past week or two, has become dehydrated at present.  Per record, she is DNR but SNF did not send any paperwork.  CSW will attempt to confirm w guardian.  CSW unable to assess patient emotional status due to her inability to interact and lack of orientation, no familiy involved w patient.  Plan is to return to [facility] at discharge.   Assessment/plan status:  Psychosocial Support/Ongoing Assessment of Needs Other assessment/ plan:   Information/referral to community resources:   Return to [facility]    PATIENT'S/FAMILY'S RESPONSE TO PLAN OF CARE: Unable to assess, patient non verbal and not oriented, no family.  Guardian agreeable to patient return to [facility].        Edwyna Shell, LCSW Clinical Social Worker  [phone])

## 2014-04-01 NOTE — Clinical Social Work Note (Signed)
Patient ready to discharge today, will return to [facility] via [place].  Patient will dc w need for IV antibiotics, facility aware.  Guardian informed and agreeable to transfer of patient today, facility aware and agreeable.  Discharge summary faxed to facility via [facility], FL2 reviewed w Rn and updated as needed, discharge packet prepared and placed w shadow chart for transfer.  EMS will be asked to transport approx 5 PM when patient is ready. CSw signing off as no further SW needs identified.  Edwyna Shell, LCSW Clinical Social Worker [phone])

## 2014-05-28 DEATH — deceased
